# Patient Record
Sex: Male | Born: 1960 | Race: White | Hispanic: No | Marital: Married | Smoking: Never smoker
Health system: Southern US, Community
[De-identification: ages and names within clinical notes are randomized; demographics above are authoritative.]

## PROBLEM LIST (undated history)

## (undated) DIAGNOSIS — J0100 Acute maxillary sinusitis, unspecified: Secondary | ICD-10-CM

## (undated) DIAGNOSIS — R002 Palpitations: Secondary | ICD-10-CM

## (undated) DIAGNOSIS — E785 Hyperlipidemia, unspecified: Secondary | ICD-10-CM

## (undated) DIAGNOSIS — R519 Headache, unspecified: Secondary | ICD-10-CM

## (undated) DIAGNOSIS — M1990 Unspecified osteoarthritis, unspecified site: Secondary | ICD-10-CM

## (undated) DIAGNOSIS — R51 Headache: Secondary | ICD-10-CM

## (undated) DIAGNOSIS — I10 Essential (primary) hypertension: Secondary | ICD-10-CM

## (undated) DIAGNOSIS — H9319 Tinnitus, unspecified ear: Secondary | ICD-10-CM

## (undated) DIAGNOSIS — E119 Type 2 diabetes mellitus without complications: Secondary | ICD-10-CM

## (undated) DIAGNOSIS — I639 Cerebral infarction, unspecified: Secondary | ICD-10-CM

## (undated) HISTORY — PX: OTHER SURGICAL HISTORY: SHX169

## (undated) HISTORY — DX: Tinnitus, unspecified ear: H93.19

## (undated) HISTORY — DX: Essential (primary) hypertension: I10

## (undated) HISTORY — DX: Headache: R51

## (undated) HISTORY — DX: Hyperlipidemia, unspecified: E78.5

## (undated) HISTORY — DX: Acute maxillary sinusitis, unspecified: J01.00

## (undated) HISTORY — DX: Type 2 diabetes mellitus without complications: E11.9

## (undated) HISTORY — DX: Cerebral infarction, unspecified: I63.9

## (undated) HISTORY — PX: WISDOM TOOTH EXTRACTION: SHX21

## (undated) HISTORY — PX: BRONCHOSCOPY: SUR163

## (undated) HISTORY — DX: Unspecified osteoarthritis, unspecified site: M19.90

## (undated) HISTORY — DX: Palpitations: R00.2

## (undated) HISTORY — DX: Headache, unspecified: R51.9

---

## 2015-09-16 DIAGNOSIS — I639 Cerebral infarction, unspecified: Secondary | ICD-10-CM

## 2015-09-16 HISTORY — DX: Cerebral infarction, unspecified: I63.9

## 2016-04-10 ENCOUNTER — Ambulatory Visit (INDEPENDENT_AMBULATORY_CARE_PROVIDER_SITE_OTHER): Payer: BC Managed Care – PPO | Admitting: Neurology

## 2016-04-10 ENCOUNTER — Encounter: Payer: Self-pay | Admitting: Neurology

## 2016-04-10 VITALS — BP 132/92 | HR 92 | Ht 73.0 in | Wt 225.6 lb

## 2016-04-10 DIAGNOSIS — I639 Cerebral infarction, unspecified: Secondary | ICD-10-CM

## 2016-04-10 DIAGNOSIS — I10 Essential (primary) hypertension: Secondary | ICD-10-CM | POA: Diagnosis not present

## 2016-04-10 DIAGNOSIS — G8194 Hemiplegia, unspecified affecting left nondominant side: Secondary | ICD-10-CM | POA: Insufficient documentation

## 2016-04-10 DIAGNOSIS — E0800 Diabetes mellitus due to underlying condition with hyperosmolarity without nonketotic hyperglycemic-hyperosmolar coma (NKHHC): Secondary | ICD-10-CM | POA: Diagnosis not present

## 2016-04-10 DIAGNOSIS — I6350 Cerebral infarction due to unspecified occlusion or stenosis of unspecified cerebral artery: Secondary | ICD-10-CM | POA: Diagnosis not present

## 2016-04-10 DIAGNOSIS — I6381 Other cerebral infarction due to occlusion or stenosis of small artery: Secondary | ICD-10-CM

## 2016-04-10 DIAGNOSIS — E119 Type 2 diabetes mellitus without complications: Secondary | ICD-10-CM | POA: Insufficient documentation

## 2016-04-10 NOTE — Progress Notes (Signed)
[facility] [address] [place]. [place] [id number] [phone]       OFFICE CONSULT NOTE  Mr. Todd SomJohn Haley Date of Birth:  12/16/1960 Medical Record Number:  [id number]   Referring MD:  Todd PeriAshish Haley Reason for Referral:  Stroke  HPI: [name] is a 55 year old Caucasian male who is accompanied today by his wife. He woke up on 03/19/16 with sudden onset of left hemiparesis. He was able to walk but clearly noticed that his left leg was dragging in his left hand was weak. [facility] CT scan of the head was unremarkable but subsequent MRI scan showed a right pontine infarct. CT angiogram of the brain and neck both revealed no significant intracranial or extracranial stenosis. I have personally reviewed the imaging studies. Transthoracic echocardiogram showed normal ejection fraction without cardiac source of embolism. The hospital records that I have reviewed the not show any results for lipid profile and A1c. Patient was started on aspirin and Plavix for stroke prevention and started physical and occupation therapy. He he had a urology consult rectally neurology. Patient is currently at home and getting outpatient therapy. Is able to walk with a cane. He has noticed improvement in his left-sided strength but his left hand and grip is still weak and he drags his left leg. His toileting aspirin and Plavix with only minor bruising but no bleeding episodes. He states his blood pressure is well controlled and today it is 132/92. He states his sugars also well controlled. He has not been started on statin. He has no prior history of strokes, TIAs or any neurological problems. He works as a Runner, broadcasting/film/videoteacher in [employer] and is currently on medical leave until next month.  ROS:   14 system review of systems is positive for  weakness, gait difficulty, slurred speech, ringing in the ears and all other systems negative PMH:  Past Medical History:  Diagnosis Date    . Acute maxillary antritis   . Arthritis   . Diabetes mellitus without complication (HCC)   . Headache   . Hyperlipidemia   . Hypertension   . Palpitations   . Stroke (HCC)   . Tinnitus     Social History:  Social History   Social History  . Marital status: Married    Spouse name: N/A  . Number of children: N/A  . Years of education: N/A   Occupational History  . Not on file.   Social History Main Topics  . Smoking status: Never Smoker  . Smokeless tobacco: Never Used  . Alcohol use 0.6 oz/week    1 Cans of beer per week     Comment: socially  . Drug use: No  . Sexual activity: Not on file   Other Topics Concern  . Not on file   Social History Narrative  . No narrative on file    Medications:   No current outpatient prescriptions on file prior to visit.   No current facility-administered medications on file prior to visit.     Allergies:   Allergies  Allergen Reactions  . Other     Peanuts allergy    Physical Exam General: well developed, well nourished middle-age Caucasian male, seated, in no evident distress Head: head normocephalic and atraumatic.   Neck: supple with no carotid or supraclavicular bruits Cardiovascular: regular rate and rhythm, no murmurs Musculoskeletal: no deformity Skin:  no rash/petichiae Vascular:  Normal pulses all extremities  Neurologic Exam Mental Status: Awake and  fully alert. Oriented to place and time. Recent and remote memory intact. Attention span, concentration and fund of knowledge appropriate. Mood and affect appropriate.  Cranial Nerves: Fundoscopic exam reveals sharp disc margins. Pupils equal, briskly reactive to light. Extraocular movements full without nystagmus. Visual fields full to confrontation. Hearing intact. Mild left lower facial weakness. Facial sensation intact. Face, tongue, palate moves normally and symmetrically.  Motor: Normal bulk and tone. Normal strength in all tested extremity muscles on the  right side but mild left hemiparesis 4/5 strength with weakness of left grip, triceps, and left hip and ankle dorsiflexors. Fine finger movements are diminished on the left. Orbits right over left upper extremity.. Tone is increased in the left leg compared to the right. Mild left finger-to-nose dysmetria. Sensory.: intact to touch , pinprick , position and vibratory sensation.  Coordination: Rapid alternating movements normal in all extremities. Finger-to-nose and heel-to-shin performed accurately bilaterally. Gait and Station: Arises from chair without difficulty. Stance is normal. Gait demonstrates mild dragging of the left leg and stiffness.. Unable to heel, toe and tandem walk without difficulty.  Reflexes: 1+ and symmetric. Toes downgoing.   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 Vascular risk factors of hypertension and diabetes.    PLAN: I had a long d/w patient and his wife about his recent  Pontine lacunar stroke, risk for recurrent stroke/TIAs, personally independently reviewed imaging studies and stroke evaluation results and answered questions.Continue Plavix 75 mg daily but discontinue aspirin as I do not see any benefit in dual antiplatelet therapy for lacunar disease for secondary stroke prevention and maintain strict control of hypertension with blood pressure goal below 130/90, diabetes with hemoglobin A1c goal below 6.5% and lipids with LDL cholesterol goal below 70 mg/dL. I also advised the patient to eat a healthy diet with plenty of whole grains, cereals, fruits and vegetables, exercise regularly and maintain ideal body weight check fasting lipid profile and hemoglobin A1c. Continue ongoing physical and occupational therapy. I advised the patient to use a cane at all times for gait and balance. Greater than 50% time during this 45 minute consultation visit  was spent on counseling and coordination of care about stroke risk, prevention and treatment He will return for follow-up with me in 2 months or call earlier if necessary Delia Heady, MD  [facility] [address] [place], [place] [address]  Phone [phone] Fax [phone] Note: This document was prepared with digital dictation and possible smart phrase technology. Any transcriptional errors that result from this process are unintentional.

## 2016-04-10 NOTE — Patient Instructions (Signed)
I had a long d/w patient and his wife about his recent  Pontine lacunar stroke, risk for recurrent stroke/TIAs, personally independently reviewed imaging studies and stroke evaluation results and answered questions.Continue Plavix 75 mg daily but discontinue aspirin as I do not see any benefit in dual antiplatelet therapy for lacunar disease for secondary stroke prevention and maintain strict control of hypertension with blood pressure goal below 130/90, diabetes with hemoglobin A1c goal below 6.5% and lipids with LDL cholesterol goal below 70 mg/dL. I also advised the patient to eat a healthy diet with plenty of whole grains, cereals, fruits and vegetables, exercise regularly and maintain ideal body weight check fasting lipid profile and hemoglobin A1c. Continue ongoing physical and occupational therapy. I advised the patient to use a cane at all times for gait and balance. He will return for follow-up with me in 2 months or call earlier if necessary. Stroke Prevention Some medical conditions and behaviors are associated with an increased chance of having a stroke. You may prevent a stroke by making healthy choices and managing medical conditions. HOW CAN I REDUCE MY RISK OF HAVING A STROKE?   Stay physically active. Get at least 30 minutes of activity on most or all days.  Do not smoke. It may also be helpful to avoid exposure to secondhand smoke.  Limit alcohol use. Moderate alcohol use is considered to be:  No more than 2 drinks per day for men.  No more than 1 drink per day for nonpregnant women.  Eat healthy foods. This involves:  Eating 5 or more servings of fruits and vegetables a day.  Making dietary changes that address high blood pressure (hypertension), high cholesterol, diabetes, or obesity.  Manage your cholesterol levels.  Making food choices that are high in fiber and low in saturated fat, trans fat, and cholesterol may control cholesterol levels.  Take any prescribed  medicines to control cholesterol as directed by your health care provider.  Manage your diabetes.  Controlling your carbohydrate and sugar intake is recommended to manage diabetes.  Take any prescribed medicines to control diabetes as directed by your health care provider.  Control your hypertension.  Making food choices that are low in salt (sodium), saturated fat, trans fat, and cholesterol is recommended to manage hypertension.  Ask your health care provider if you need treatment to lower your blood pressure. Take any prescribed medicines to control hypertension as directed by your health care provider.  If you are 6118-55 years of age, have your blood pressure checked every 3-5 years. If you are 55 years of age or older, have your blood pressure checked every year.  Maintain a healthy weight.  Reducing calorie intake and making food choices that are low in sodium, saturated fat, trans fat, and cholesterol are recommended to manage weight.  Stop drug abuse.  Avoid taking birth control pills.  Talk to your health care provider about the risks of taking birth control pills if you are over 55 years old, smoke, get migraines, or have ever had a blood clot.  Get evaluated for sleep disorders (sleep apnea).  Talk to your health care provider about getting a sleep evaluation if you snore a lot or have excessive sleepiness.  Take medicines only as directed by your health care provider.  For some people, aspirin or blood thinners (anticoagulants) are helpful in reducing the risk of forming abnormal blood clots that can lead to stroke. If you have the irregular heart rhythm of atrial fibrillation, you should be  on a blood thinner unless there is a good reason you cannot take them.  Understand all your medicine instructions.  Make sure that other conditions (such as anemia or atherosclerosis) are addressed. SEEK IMMEDIATE MEDICAL CARE IF:   You have sudden weakness or numbness of the  face, arm, or leg, especially on one side of the body.  Your face or eyelid droops to one side.  You have sudden confusion.  You have trouble speaking (aphasia) or understanding.  You have sudden trouble seeing in one or both eyes.  You have sudden trouble walking.  You have dizziness.  You have a loss of balance or coordination.  You have a sudden, severe headache with no known cause.  You have new chest pain or an irregular heartbeat. Any of these symptoms may represent a serious problem that is an emergency. Do not wait to see if the symptoms will go away. Get medical help at once. Call your local emergency services (911 in [place] Do not drive yourself to the hospital.   This information is not intended to replace advice given to you by your health care provider. Make sure you discuss any questions you have with your health care provider.   Document Released: 08/23/2004 Document Revised: 08/06/2014 Document Reviewed: 01/16/2013 Elsevier Interactive Patient Education [employer]

## 2016-06-04 ENCOUNTER — Ambulatory Visit: Payer: BC Managed Care – PPO | Admitting: Neurology

## 2018-07-30 DIAGNOSIS — I499 Cardiac arrhythmia, unspecified: Secondary | ICD-10-CM

## 2018-07-30 HISTORY — DX: Cardiac arrhythmia, unspecified: I49.9

## 2018-12-22 DIAGNOSIS — I219 Acute myocardial infarction, unspecified: Secondary | ICD-10-CM

## 2018-12-22 HISTORY — DX: Acute myocardial infarction, unspecified: I21.9

## 2018-12-23 ENCOUNTER — Ambulatory Visit (HOSPITAL_COMMUNITY)
Admission: EM | Disposition: A | Discharge status: Home / Self Care | Payer: Self-pay | Source: Other Acute Inpatient Hospital | Attending: Cardiology

## 2018-12-23 ENCOUNTER — Encounter (HOSPITAL_COMMUNITY): Payer: Self-pay

## 2018-12-23 ENCOUNTER — Observation Stay (HOSPITAL_COMMUNITY)
Admission: EM | Admit: 2018-12-23 | Discharge: 2018-12-24 | Disposition: A | Discharge status: Home / Self Care | Payer: BC Managed Care – PPO | Source: Other Acute Inpatient Hospital | Attending: Cardiology | Admitting: Cardiology

## 2018-12-23 ENCOUNTER — Other Ambulatory Visit: Payer: Self-pay

## 2018-12-23 DIAGNOSIS — E119 Type 2 diabetes mellitus without complications: Secondary | ICD-10-CM | POA: Diagnosis not present

## 2018-12-23 DIAGNOSIS — I10 Essential (primary) hypertension: Secondary | ICD-10-CM | POA: Insufficient documentation

## 2018-12-23 DIAGNOSIS — E876 Hypokalemia: Secondary | ICD-10-CM | POA: Diagnosis not present

## 2018-12-23 DIAGNOSIS — G8194 Hemiplegia, unspecified affecting left nondominant side: Secondary | ICD-10-CM | POA: Diagnosis not present

## 2018-12-23 DIAGNOSIS — Z8249 Family history of ischemic heart disease and other diseases of the circulatory system: Secondary | ICD-10-CM | POA: Insufficient documentation

## 2018-12-23 DIAGNOSIS — I2510 Atherosclerotic heart disease of native coronary artery without angina pectoris: Principal | ICD-10-CM

## 2018-12-23 DIAGNOSIS — Z79899 Other long term (current) drug therapy: Secondary | ICD-10-CM | POA: Diagnosis not present

## 2018-12-23 DIAGNOSIS — Z7984 Long term (current) use of oral hypoglycemic drugs: Secondary | ICD-10-CM | POA: Insufficient documentation

## 2018-12-23 DIAGNOSIS — Z7982 Long term (current) use of aspirin: Secondary | ICD-10-CM | POA: Insufficient documentation

## 2018-12-23 DIAGNOSIS — I214 Non-ST elevation (NSTEMI) myocardial infarction: Secondary | ICD-10-CM | POA: Diagnosis present

## 2018-12-23 DIAGNOSIS — Z8673 Personal history of transient ischemic attack (TIA), and cerebral infarction without residual deficits: Secondary | ICD-10-CM | POA: Insufficient documentation

## 2018-12-23 DIAGNOSIS — Z7902 Long term (current) use of antithrombotics/antiplatelets: Secondary | ICD-10-CM | POA: Insufficient documentation

## 2018-12-23 DIAGNOSIS — Z823 Family history of stroke: Secondary | ICD-10-CM | POA: Diagnosis not present

## 2018-12-23 DIAGNOSIS — Z1159 Encounter for screening for other viral diseases: Secondary | ICD-10-CM | POA: Diagnosis not present

## 2018-12-23 DIAGNOSIS — I6350 Cerebral infarction due to unspecified occlusion or stenosis of unspecified cerebral artery: Secondary | ICD-10-CM | POA: Diagnosis present

## 2018-12-23 DIAGNOSIS — E785 Hyperlipidemia, unspecified: Secondary | ICD-10-CM | POA: Diagnosis not present

## 2018-12-23 DIAGNOSIS — Z9861 Coronary angioplasty status: Secondary | ICD-10-CM

## 2018-12-23 DIAGNOSIS — M1990 Unspecified osteoarthritis, unspecified site: Secondary | ICD-10-CM | POA: Insufficient documentation

## 2018-12-23 HISTORY — PX: CORONARY STENT INTERVENTION: CATH118234

## 2018-12-23 HISTORY — PX: LEFT HEART CATH AND CORONARY ANGIOGRAPHY: CATH118249

## 2018-12-23 LAB — BASIC METABOLIC PANEL
Anion gap: 11 (ref 5–15)
BUN: 19 mg/dL (ref 6–20)
CO2: 28 mmol/L (ref 22–32)
Calcium: 9.3 mg/dL (ref 8.9–10.3)
Chloride: 101 mmol/L (ref 98–111)
Creatinine, Ser: 0.85 mg/dL (ref 0.61–1.24)
GFR calc Af Amer: >60 mL/min (ref >60)
GFR calc non Af Amer: >60 mL/min (ref >60)
Glucose, Bld: 186 mg/dL — ABNORMAL HIGH (ref 70–99)
Potassium: 3.2 mmol/L — ABNORMAL LOW (ref 3.5–5.1)
Sodium: 140 mmol/L (ref 135–145)

## 2018-12-23 LAB — CBC
HCT: 44.5 % (ref 39.0–52.0)
Hemoglobin: 15.2 g/dL (ref 13.0–17.0)
MCH: 28.6 pg (ref 26.0–34.0)
MCHC: 34.2 g/dL (ref 30.0–36.0)
MCV: 83.6 fL (ref 80.0–100.0)
Platelets: 174 10*3/uL (ref 150–400)
RBC: 5.32 MIL/uL (ref 4.22–5.81)
RDW: 13 % (ref 11.5–15.5)
WBC: 8.4 10*3/uL (ref 4.0–10.5)
nRBC: 0 % (ref 0.0–0.2)

## 2018-12-23 LAB — LIPID PANEL
Cholesterol: 214 mg/dL — ABNORMAL HIGH (ref 0–200)
HDL: 41 mg/dL (ref >40)
LDL Cholesterol: 131 mg/dL — ABNORMAL HIGH (ref 0–99)
Total CHOL/HDL Ratio: 5.2 RATIO
Triglycerides: 210 mg/dL — ABNORMAL HIGH (ref <150)
VLDL: 42 mg/dL — ABNORMAL HIGH (ref 0–40)

## 2018-12-23 LAB — HEMOGLOBIN A1C
Hgb A1c MFr Bld: 7.9 % — ABNORMAL HIGH (ref 4.8–5.6)
Mean Plasma Glucose: 180.03 mg/dL

## 2018-12-23 LAB — TROPONIN I: Troponin I: 9.11 ng/mL (ref <0.03)

## 2018-12-23 LAB — GLUCOSE, CAPILLARY
Glucose-Capillary: 153 mg/dL — ABNORMAL HIGH (ref 70–99)
Glucose-Capillary: 129 mg/dL — ABNORMAL HIGH (ref 70–99)
Glucose-Capillary: 201 mg/dL — ABNORMAL HIGH (ref 70–99)

## 2018-12-23 LAB — HEPARIN LEVEL (UNFRACTIONATED): Heparin Unfractionated: 0.36 IU/mL (ref 0.30–0.70)

## 2018-12-23 LAB — POCT ACTIVATED CLOTTING TIME
Activated Clotting Time: 279 seconds
Activated Clotting Time: 296 seconds

## 2018-12-23 LAB — MRSA PCR SCREENING: MRSA by PCR: NEGATIVE

## 2018-12-23 LAB — SARS CORONAVIRUS 2 BY RT PCR (HOSPITAL ORDER, PERFORMED IN ~~LOC~~ HOSPITAL LAB): SARS Coronavirus 2: NEGATIVE

## 2018-12-23 SURGERY — LEFT HEART CATH AND CORONARY ANGIOGRAPHY
Anesthesia: LOCAL

## 2018-12-23 MED ORDER — NITROGLYCERIN 0.4 MG SL SUBL: 0.4000 mg | SUBLINGUAL_TABLET | SUBLINGUAL | Status: DC | PRN

## 2018-12-23 MED ORDER — IRBESARTAN 150 MG PO TABS
300.0000 mg | ORAL_TABLET | Freq: Every day | ORAL | Status: DC
  Administered 2018-12-23 – 2018-12-24 (×2): 300 mg via ORAL
  Filled 2018-12-23 (×2): qty 2

## 2018-12-23 MED ORDER — VALSARTAN-HYDROCHLOROTHIAZIDE 320-25 MG PO TABS: 1.0000 | ORAL_TABLET | Freq: Every day | ORAL | Status: DC

## 2018-12-23 MED ORDER — INSULIN ASPART 100 UNIT/ML ~~LOC~~ SOLN
0.0000 [IU] | Freq: Three times a day (TID) | SUBCUTANEOUS | Status: DC
  Administered 2018-12-23: 3 [IU] via SUBCUTANEOUS
  Administered 2018-12-24: 2 [IU] via SUBCUTANEOUS

## 2018-12-23 MED ORDER — SODIUM CHLORIDE 0.9% FLUSH
3.0000 mL | Freq: Two times a day (BID) | INTRAVENOUS | Status: DC
  Administered 2018-12-24: 03:00:00 3 mL via INTRAVENOUS

## 2018-12-23 MED ORDER — SODIUM CHLORIDE 0.9 % WEIGHT BASED INFUSION
3.0000 mL/kg/h | INTRAVENOUS | Status: AC
  Administered 2018-12-23: 3 mL/kg/h via INTRAVENOUS

## 2018-12-23 MED ORDER — NITROGLYCERIN 1 MG/10 ML FOR IR/CATH LAB
INTRA_ARTERIAL | Status: DC | PRN
  Administered 2018-12-23: 150 ug via INTRACORONARY

## 2018-12-23 MED ORDER — SODIUM CHLORIDE 0.9 % IV SOLN: 250.0000 mL | INTRAVENOUS | Status: DC | PRN

## 2018-12-23 MED ORDER — MIDAZOLAM HCL 5 MG/5ML IJ SOLN
INTRAMUSCULAR | Status: AC
  Filled 2018-12-23: qty 5

## 2018-12-23 MED ORDER — HEPARIN (PORCINE) 25000 UT/250ML-% IV SOLN
1200.0000 [IU]/h | INTRAVENOUS | Status: DC
  Administered 2018-12-23: 1200 [IU]/h via INTRAVENOUS

## 2018-12-23 MED ORDER — LIDOCAINE HCL (PF) 1 % IJ SOLN
INTRAMUSCULAR | Status: DC | PRN
  Administered 2018-12-23: 3 mL

## 2018-12-23 MED ORDER — HEPARIN (PORCINE) IN NACL 1000-0.9 UT/500ML-% IV SOLN
INTRAVENOUS | Status: DC | PRN
  Administered 2018-12-23: 500 mL

## 2018-12-23 MED ORDER — HEPARIN (PORCINE) IN NACL 1000-0.9 UT/500ML-% IV SOLN
INTRAVENOUS | Status: AC
  Filled 2018-12-23: qty 1000

## 2018-12-23 MED ORDER — METOPROLOL TARTRATE 12.5 MG HALF TABLET
12.5000 mg | ORAL_TABLET | Freq: Two times a day (BID) | ORAL | Status: DC
  Administered 2018-12-23 – 2018-12-24 (×3): 12.5 mg via ORAL
  Filled 2018-12-23 (×3): qty 1

## 2018-12-23 MED ORDER — SODIUM CHLORIDE 0.9% FLUSH: 3.0000 mL | INTRAVENOUS | Status: DC | PRN

## 2018-12-23 MED ORDER — HEPARIN SODIUM (PORCINE) 1000 UNIT/ML IJ SOLN
INTRAMUSCULAR | Status: AC
  Filled 2018-12-23: qty 1

## 2018-12-23 MED ORDER — TICAGRELOR 90 MG PO TABS
ORAL_TABLET | ORAL | Status: AC
  Filled 2018-12-23: qty 2

## 2018-12-23 MED ORDER — FENTANYL CITRATE (PF) 100 MCG/2ML IJ SOLN
INTRAMUSCULAR | Status: AC
  Filled 2018-12-23: qty 2

## 2018-12-23 MED ORDER — FENTANYL CITRATE (PF) 100 MCG/2ML IJ SOLN
INTRAMUSCULAR | Status: DC | PRN
  Administered 2018-12-23 (×2): 25 ug via INTRAVENOUS

## 2018-12-23 MED ORDER — VERAPAMIL HCL 2.5 MG/ML IV SOLN
INTRAVENOUS | Status: AC
  Filled 2018-12-23: qty 2

## 2018-12-23 MED ORDER — LIDOCAINE HCL (PF) 1 % IJ SOLN
INTRAMUSCULAR | Status: AC
  Filled 2018-12-23: qty 30

## 2018-12-23 MED ORDER — ATORVASTATIN CALCIUM 40 MG PO TABS
40.0000 mg | ORAL_TABLET | Freq: Every day | ORAL | Status: DC
  Administered 2018-12-23: 40 mg via ORAL
  Filled 2018-12-23: qty 1

## 2018-12-23 MED ORDER — SODIUM CHLORIDE 0.9 % WEIGHT BASED INFUSION
1.0000 mL/kg/h | INTRAVENOUS | Status: AC
  Administered 2018-12-23 (×2): 1 mL/kg/h via INTRAVENOUS

## 2018-12-23 MED ORDER — HEPARIN SODIUM (PORCINE) 1000 UNIT/ML IJ SOLN
INTRAMUSCULAR | Status: DC | PRN
  Administered 2018-12-23: 2000 [IU] via INTRAVENOUS
  Administered 2018-12-23 (×2): 5000 [IU] via INTRAVENOUS

## 2018-12-23 MED ORDER — HYDRALAZINE HCL 20 MG/ML IJ SOLN: 10.0000 mg | INTRAMUSCULAR | Status: AC | PRN

## 2018-12-23 MED ORDER — NITROGLYCERIN 1 MG/10 ML FOR IR/CATH LAB
INTRA_ARTERIAL | Status: AC
  Filled 2018-12-23: qty 10

## 2018-12-23 MED ORDER — VERAPAMIL HCL 2.5 MG/ML IV SOLN
INTRAVENOUS | Status: DC | PRN
  Administered 2018-12-23: 10 mL via INTRA_ARTERIAL

## 2018-12-23 MED ORDER — POTASSIUM CHLORIDE CRYS ER 20 MEQ PO TBCR
60.0000 meq | EXTENDED_RELEASE_TABLET | Freq: Once | ORAL | Status: AC
  Administered 2018-12-23: 60 meq via ORAL
  Filled 2018-12-23: qty 3

## 2018-12-23 MED ORDER — TICAGRELOR 90 MG PO TABS
90.0000 mg | ORAL_TABLET | Freq: Two times a day (BID) | ORAL | Status: DC
  Administered 2018-12-24: 90 mg via ORAL
  Filled 2018-12-23: qty 1

## 2018-12-23 MED ORDER — HYDROCHLOROTHIAZIDE 25 MG PO TABS
25.0000 mg | ORAL_TABLET | Freq: Every day | ORAL | Status: DC
  Administered 2018-12-23 – 2018-12-24 (×2): 25 mg via ORAL
  Filled 2018-12-23 (×2): qty 1

## 2018-12-23 MED ORDER — TICAGRELOR 90 MG PO TABS
ORAL_TABLET | ORAL | Status: DC | PRN
  Administered 2018-12-23: 180 mg via ORAL

## 2018-12-23 MED ORDER — SODIUM CHLORIDE 0.9 % WEIGHT BASED INFUSION
1.0000 mL/kg/h | INTRAVENOUS | Status: DC
  Administered 2018-12-23: 13:00:00 1 mL/kg/h via INTRAVENOUS

## 2018-12-23 MED ORDER — ASPIRIN EC 81 MG PO TBEC
81.0000 mg | DELAYED_RELEASE_TABLET | Freq: Every day | ORAL | Status: DC
  Administered 2018-12-23 – 2018-12-24 (×2): 81 mg via ORAL
  Filled 2018-12-23 (×2): qty 1

## 2018-12-23 MED ORDER — IOHEXOL 350 MG/ML SOLN
INTRAVENOUS | Status: DC | PRN
  Administered 2018-12-23: 150 mL via INTRA_ARTERIAL

## 2018-12-23 MED ORDER — ONDANSETRON HCL 4 MG/2ML IJ SOLN: 4.0000 mg | Freq: Four times a day (QID) | INTRAMUSCULAR | Status: DC | PRN

## 2018-12-23 MED ORDER — CLOPIDOGREL BISULFATE 75 MG PO TABS
75.0000 mg | ORAL_TABLET | Freq: Every day | ORAL | Status: DC
  Administered 2018-12-23: 75 mg via ORAL
  Filled 2018-12-23: qty 1

## 2018-12-23 MED ORDER — MIDAZOLAM HCL 2 MG/2ML IJ SOLN
INTRAMUSCULAR | Status: DC | PRN
  Administered 2018-12-23: 1 mg via INTRAVENOUS
  Administered 2018-12-23: 2 mg via INTRAVENOUS

## 2018-12-23 MED ORDER — LABETALOL HCL 5 MG/ML IV SOLN: 10.0000 mg | INTRAVENOUS | Status: AC | PRN

## 2018-12-23 MED ORDER — ACETAMINOPHEN 325 MG PO TABS: 650.0000 mg | ORAL_TABLET | ORAL | Status: DC | PRN

## 2018-12-23 SURGICAL SUPPLY — 23 items
BALLN SAPPHIRE 2.0X15 (BALLOONS) ×2
BALLN SAPPHIRE 2.5X12 (BALLOONS) ×2
BALLN SAPPHIRE ~~LOC~~ 3.5X12 (BALLOONS) ×1 IMPLANT
BALLN SAPPHIRE ~~LOC~~ 3.5X18 (BALLOONS) ×1 IMPLANT
BALLOON SAPPHIRE 2.0X15 (BALLOONS) IMPLANT
BALLOON SAPPHIRE 2.5X12 (BALLOONS) IMPLANT
CATH 5FR JL3.5 JR4 ANG PIG MP (CATHETERS) ×1 IMPLANT
CATH LAUNCHER 6FR EBU3.5 (CATHETERS) ×1 IMPLANT
CATH LAUNCHER 6FR JR4 (CATHETERS) ×1 IMPLANT
DEVICE RAD COMP TR BAND LRG (VASCULAR PRODUCTS) ×1 IMPLANT
GLIDESHEATH SLEND SS 6F .021 (SHEATH) ×1 IMPLANT
GUIDEWIRE INQWIRE 1.5J.035X260 (WIRE) IMPLANT
INQWIRE 1.5J .035X260CM (WIRE) ×2
KIT ENCORE 26 ADVANTAGE (KITS) ×1 IMPLANT
KIT HEART LEFT (KITS) ×2 IMPLANT
PACK CARDIAC CATHETERIZATION (CUSTOM PROCEDURE TRAY) ×2 IMPLANT
STENT RESOLUTE ONYX 2.0X22 (Permanent Stent) ×1 IMPLANT
STENT RESOLUTE ONYX 3.0X15 (Permanent Stent) ×1 IMPLANT
STENT RESOLUTE ONYX 3.0X26 (Permanent Stent) ×1 IMPLANT
STENT RESOLUTE ONYX 3.0X30 (Permanent Stent) ×1 IMPLANT
TRANSDUCER W/STOPCOCK (MISCELLANEOUS) ×2 IMPLANT
TUBING CIL FLEX 10 FLL-RA (TUBING) ×2 IMPLANT
WIRE COUGAR XT STRL 190CM (WIRE) ×1 IMPLANT

## 2018-12-23 NOTE — Progress Notes (Signed)
Pt belonging brought to front desk in black bagpack.  RN delivered to patient at bedside.

## 2018-12-23 NOTE — Progress Notes (Signed)
CRITICAL VALUE ALERT  Critical Value:  Troponin 9.11   Date & Time Notied:  [id number]  Dr. Jens Som noted initial troponin in notes. RN will continue to monitor.

## 2018-12-23 NOTE — Progress Notes (Addendum)
Patient here via EMS. Ambulatory. Heparin drip infusing at 1200 units. Alert and oriented x3. Able to make needs known. Answers questions appropriately. Denies pain and shortness of breath. Vitals WNL. Placed on telemetry. Afebrile. Paged cardiology to make aware of admission. Call light within reach. Placed on monitor. Heart rate 60, sinus brady. Skin assessment done with Ranae RN. Old scabs noted to bilateral lower legs. Blanchable redness noted to buttocks. Call light within reach. Oriented to room.

## 2018-12-23 NOTE — H&P (Signed)
Cardiology Admission History and Physical:   Patient ID: Todd Haley MRN: [id number]; DOB: Oct 26, 1960   Admission date: 12/23/2018  Primary Care Provider: Monico Blitz, MD Primary Cardiologist: No primary care provider on file.  Primary Electrophysiologist:  None   Chief Complaint:  Chest Pain/ NSTEMI  Patient Profile:   Todd Haley is a 58 y.o. male with a history of stroke in 2017 on Plavix, hypertension, hyperlipidemia, diabetes mellitus, and arthritis, who is transferred from [facility] for a NSTEMI.  History of Present Illness:   Mr. Todd Haley is a 58 year old male with a history of stroke in 2017, hypertension, hyperlipidemia, diabetes mellitus, and arthritis. Patient has never seen a Cardiologist or had any type of cardiac work-up.   Patient presented to the [facility] ED yesterday for evaluation of sudden substernal non-radiating chest tightness. He describes it as more of a discomfort rather than a pain and ranks it as a 3/10 on the pain scale. Onset of chest discomfort occurred at rest after eating dinner (steak and baked potato). Patient initially thought it may be reflux so he took Tums with no relief. No associated shortness of breath, diaphoresis, nausea, or vomiting. He reports he felt like his heart was racing but denies any lightheadedness, dizziness, or syncope. No orthopnea, PND, or edema. Patient measured his BP and states it was very elevated at 175/110. He states his BP is usually around 130/80. No recent fevers, chills, or body aches. He reports occasional nasal congestion due to seasonal allergies but denies any cough.  In the ED, patient hypertensive with systolic BP in the 409'W but vitals stable. EKG showed normal sinus rhythm with possible mild ST elevation in inferior leads. Troponin T elevated at 0.13 >> 0.27 >> 0.51. Chest x-ray showed no acute findings. WBC 10.1, Hgb 15.4, Plts 204. Na 141, K 3.9, Glucose 227, SCr 0.87. AST 26.9, ALT 17, Alk Phos 130. Urine  drug screen negative. Patient received Aspirin '324mg'$  and was started on IV Heparin in the ED.   Patient was transferred to [facility] for further evaluation.   At the time of this evaluation, patient is completely pain free and denies any other symptoms. Patient states the pain lasted for about 1 hour and resolved without any medications.   Patient denies any history of tobacco use.   Past Medical History:  Diagnosis Date  . Acute maxillary antritis   . Arthritis   . Diabetes mellitus without complication ([place])   . Headache   . Hyperlipidemia   . Hypertension   . Palpitations   . Stroke ([place])   . Tinnitus     Past Surgical History:  Procedure Laterality Date  . colonscopy       Medications Prior to Admission: Prior to Admission medications   Medication Sig Start Date End Date Taking? Authorizing Provider  aspirin EC 81 MG tablet Take 81 mg by mouth daily.    [provider]  clopidogrel (PLAVIX) 75 MG tablet  03/21/16   [provider]  JARDIANCE 10 MG TABS tablet  03/29/16   [provider]  metFORMIN (GLUCOPHAGE) 500 MG tablet  01/13/16   [provider]  traMADol (ULTRAM) 50 MG tablet  03/29/16   [provider]  valsartan-hydrochlorothiazide (DIOVAN-HCT) 320-25 MG tablet Take 1 tablet by mouth daily.    [provider]     Allergies:    Allergies  Allergen Reactions  . Other     Peanuts allergy    Social History:  Social History   Socioeconomic History  . Marital status: Married    Spouse name: Not on file  . Number of children: Not on file  . Years of education: Not on file  . Highest education level: Not on file  Occupational History  . Not on file  Social Needs  . Financial resource strain: Not on file  . Food insecurity:    Worry: Not on file    Inability: Not on file  . Transportation needs:    Medical: Not on file    Non-medical: Not on file  Tobacco Use  . Smoking status: Never Smoker  .  Smokeless tobacco: Never Used  Substance and Sexual Activity  . Alcohol use: Yes    Alcohol/week: 1.0 standard drinks    Types: 1 Cans of beer per week    Comment: socially  . Drug use: No  . Sexual activity: Not on file  Lifestyle  . Physical activity:    Days per week: Not on file    Minutes per session: Not on file  . Stress: Not on file  Relationships  . Social connections:    Talks on phone: Not on file    Gets together: Not on file    Attends religious service: Not on file    Active member of club or organization: Not on file    Attends meetings of clubs or organizations: Not on file    Relationship status: Not on file  . Intimate partner violence:    Fear of current or ex partner: Not on file    Emotionally abused: Not on file    Physically abused: Not on file    Forced sexual activity: Not on file  Other Topics Concern  . Not on file  Social History Narrative  . Not on file    Family History:   The patient's family history includes Alzheimer's disease in his father; Lung cancer in his mother; Stroke in his maternal grandfather.    ROS:  Please see the history of present illness.  Review of Systems  Constitutional: Negative for chills, diaphoresis and fever.  HENT: Positive for congestion. Negative for sore throat.   Respiratory: Negative for cough, hemoptysis and shortness of breath.   Cardiovascular: Positive for chest pain and palpitations. Negative for orthopnea, leg swelling and PND.  Gastrointestinal: Negative for blood in stool, nausea and vomiting.  Genitourinary: Negative for hematuria.  Musculoskeletal: Negative for myalgias.  Neurological: Negative for dizziness and loss of consciousness.  Endo/Heme/Allergies: Positive for environmental allergies.  Psychiatric/Behavioral: Negative for substance abuse.     Physical Exam/Data:   Vitals:   12/23/18 0600  BP: (!) 149/92  Pulse: 60  Resp: 13  Temp: 98.5 F (36.9 C)  TempSrc: Oral  SpO2: 100%   Weight: 96.7 kg  Height: '6\' 2"'$  (1.88 m)   No intake or output data in the 24 hours ending 12/23/18 0651 Last 3 Weights 12/23/2018 04/10/2016  Weight (lbs) 213 lb 3 oz 225 lb 9.6 oz  Weight (kg) 96.7 kg 102.331 kg     Body mass index is 27.37 kg/m.   Physical Exam per MD:  General:  Well nourished, well developed, in no acute distress. HEENT: Normal Lymph: No adenopathy. Neck: No JVD. Endocrine:  No thryomegaly. Vascular: No carotid bruits. FA pulses 2+ bilaterally without bruits  Cardiac:  RRR. Distinct S1 and S2. No murmurs, gallops, or rubs. Lungs:  Clear to auscultation bilaterally. No wheezing, rhonchi or rales.  Abd: Soft, non-distended,  and non-tender. Bowel sounds present. Ext: No edema Musculoskeletal:  No deformities, BUE and BLE strength normal and equal Skin: Warm and dry. Neuro:  Alert and oriented x3. No focal deficits.  Psych:  Normal affect    EKG:  The ECG that was done was personally reviewed and demonstrates sinus bradycardia, rate 55 bpm, with minimal ST elevation in leads III and aVF.    Assessment and Plan:   NSTEMI - Patient transferred here from [facility] for NSTEMI. - EKG shows sinus rhythm with possible mild ST elevation in leads III and aVF. - Troponin T at [facility] elevated at 0.13 >> 0.27 >> 0.51. - Patient is currently chest pain free. - Continue IV Heparin.  - Will check hemoglobin A1c and lipid panel.  - Continue IV Heparin. - Patient on Plavix '75mg'$  daily for prior stroke.  - Will start Aspirin '81mg'$  and high-intensity statin. - Anticipate cardiac catheterization later today. Will discuss with MD.  Hypertension - BP elevated in the 170's/110's prior to ED presentation. Most recent BP 136/81. - Continue home Valsartan/HCTZ 320-'25mg'$  daily.  Hyperlipidemia - Patient has a history of hyperlipidemia but does not appear to be on any medications at home. - Will check lipid panel. - Will start Lipitor '40mg'$  daily. Alk Phos mildly  elevated at 130 at [facility] so will need to monitor liver function. Will recheck CMET tomorrow.  Diabetes Mellitus - Patient on Jardiance '25mg'$  daily and Metformin '1000mg'$  twice daily at home. - Will start start sliding scale insulin here.   History of Stroke - Patient has a history of stroke in 2017 and is on Plavix '75mg'$  daily.  Severity of Illness: The appropriate patient status for this patient is OBSERVATION. Observation status is judged to be reasonable and necessary in order to provide the required intensity of service to ensure the patient's safety. The patient's presenting symptoms, physical exam findings, and initial radiographic and laboratory data in the context of their medical condition is felt to place them at decreased risk for further clinical deterioration. Furthermore, it is anticipated that the patient will be medically stable for discharge from the hospital within 2 midnights of admission. The following factors support the patient status of observation.   " The patient's presenting symptoms include chest pain. " The physical exam findings are as above. " The initial radiographic and laboratory data are elevated troponin.     For questions or updates, please contact [place] Please consult [email or web address] for contact info under        Signed, Darreld Mclean, PA-C  12/23/2018 6:51 AM   As above, patient seen and examined.  Briefly he is a 58 year old male with past medical history of diabetes mellitus for 5 years, hypertension, hyperlipidemia, prior CVA admitted with non-ST elevation myocardial infarction.  No prior cardiac history.  After eating dinner last evening he developed pain in the substernal/left chest area described as a tightness.  There was no radiation.  No associated symptoms.  Not pleuritic or positional.  Lasted 1 hour and resolved.  Patient seen at [facility] and laboratories showed elevated troponin T of 0.51.  Initial tropnin here 9.11.   Transferred for further management.  Presently pain-free.  Electrocardiogram showed subtle T wave inversion in the inferior leads.  1 non-ST elevation myocardial infarction-continue aspirin, heparin; continue Plavix as he has been on this medication at home for prior CVA.  Add low-dose metoprolol 12.5 mg twice daily.  Add Lipitor.  Plan to proceed with cardiac  catheterization.  The risks and benefits including myocardial infarction, CVA and death discussed and he agrees to proceed.  2 hypertension-patient's blood pressure was elevated at time of admission but now improved.  Continue present medications and follow.  3 diabetes mellitus-follow CBGs.  Hold Glucophage for 48 hours following procedure.  4 hyperlipidemia-add Lipitor 40 mg daily.  Check lipids and liver in 8 weeks.  5 mildly elevated alkaline phosphatase-follow-up primary care following discharge.  Patient states he has had some elevation in liver functions in the past.  6 history of CVA-on Plavix prior to admission.  Kirk Ruths, MD

## 2018-12-23 NOTE — Interval H&P Note (Signed)
Cath Lab Visit (complete for each Cath Lab visit)  Clinical Evaluation Leading to the Procedure:   ACS: Yes.    Non-ACS:    Anginal Classification: CCS IV  Anti-ischemic medical therapy: Minimal Therapy (1 class of medications)  Non-Invasive Test Results: No non-invasive testing performed  Prior CABG: No previous CABG      History and Physical Interval Note:  12/23/2018 3:52 PM  Todd Haley  has presented today for surgery, with the diagnosis of nonstemi.  The various methods of treatment have been discussed with the patient and family. After consideration of risks, benefits and other options for treatment, the patient has consented to  Procedure(s): LEFT HEART CATH AND CORONARY ANGIOGRAPHY (N/A) as a surgical intervention.  The patient's history has been reviewed, patient examined, no change in status, stable for surgery.  I have reviewed the patient's chart and labs.  Questions were answered to the patient's satisfaction.     Tonny Bollman

## 2018-12-23 NOTE — Progress Notes (Addendum)
ANTICOAGULATION CONSULT NOTE - Initial Consult  Pharmacy Consult for heparin Indication: chest pain/ACS  Allergies  Allergen Reactions  . Other     Peanuts allergy    Patient Measurements: Height: 6\' 2"  (188 cm) Weight: 213 lb 3 oz (96.7 kg) IBW/kg (Calculated) : 82.2 Heparin Dosing Weight: 96kg  Vital Signs: Temp: 97.7 F (36.5 C) (05/26 0832) Temp Source: Oral (05/26 0832) BP: 124/79 (05/26 0832) Pulse Rate: 62 (05/26 0832)  Labs: Recent Labs    12/23/18 0729  TROPONINI 9.11*    CrCl cannot be calculated (No successful lab value found.).   Medical History: Past Medical History:  Diagnosis Date  . Acute maxillary antritis   . Arthritis   . Diabetes mellitus without complication (HCC)   . Headache   . Hyperlipidemia   . Hypertension   . Palpitations   . Stroke (HCC)   . Tinnitus     Assessment: 58 year old male transferred from [facility] for chest pain, r/o acs. Patient was started on IV heparin last night, continues today. Cards planning cath. Baseline cbc wnl, renal function is normal. No anticoagulation prior to admit.   Goal of Therapy:  Heparin level 0.3-0.7 units/ml Monitor platelets by anticoagulation protocol: Yes   Plan:  Continue heparin infusion at 1200 units/hr Check anti-Xa level this am unless cath Continue to monitor H&H and platelets  Sheppard Coil PharmD., BCPS Clinical Pharmacist 12/23/2018 8:51 AM  Addendum:  Heparin level this morning is within goal range. No dose adjustments warranted at this time.   12/23/2018 12:55 PM

## 2018-12-24 ENCOUNTER — Other Ambulatory Visit: Payer: Self-pay | Admitting: Cardiology

## 2018-12-24 ENCOUNTER — Encounter (HOSPITAL_COMMUNITY): Payer: Self-pay | Admitting: Cardiovascular Disease

## 2018-12-24 DIAGNOSIS — E119 Type 2 diabetes mellitus without complications: Secondary | ICD-10-CM | POA: Diagnosis not present

## 2018-12-24 DIAGNOSIS — I10 Essential (primary) hypertension: Secondary | ICD-10-CM | POA: Diagnosis not present

## 2018-12-24 DIAGNOSIS — I214 Non-ST elevation (NSTEMI) myocardial infarction: Secondary | ICD-10-CM | POA: Diagnosis not present

## 2018-12-24 DIAGNOSIS — I2510 Atherosclerotic heart disease of native coronary artery without angina pectoris: Secondary | ICD-10-CM | POA: Diagnosis not present

## 2018-12-24 DIAGNOSIS — E785 Hyperlipidemia, unspecified: Secondary | ICD-10-CM

## 2018-12-24 DIAGNOSIS — E876 Hypokalemia: Secondary | ICD-10-CM

## 2018-12-24 DIAGNOSIS — Z9861 Coronary angioplasty status: Secondary | ICD-10-CM

## 2018-12-24 LAB — COMPREHENSIVE METABOLIC PANEL
ALT: 21 U/L (ref 0–44)
AST: 35 U/L (ref 15–41)
Albumin: 3.5 g/dL (ref 3.5–5.0)
Alkaline Phosphatase: 87 U/L (ref 38–126)
Anion gap: 10 (ref 5–15)
BUN: 20 mg/dL (ref 6–20)
CO2: 23 mmol/L (ref 22–32)
Calcium: 8.5 mg/dL — ABNORMAL LOW (ref 8.9–10.3)
Chloride: 106 mmol/L (ref 98–111)
Creatinine, Ser: 0.85 mg/dL (ref 0.61–1.24)
GFR calc Af Amer: >60 mL/min (ref >60)
GFR calc non Af Amer: >60 mL/min (ref >60)
Glucose, Bld: 144 mg/dL — ABNORMAL HIGH (ref 70–99)
Potassium: 3.1 mmol/L — ABNORMAL LOW (ref 3.5–5.1)
Sodium: 139 mmol/L (ref 135–145)
Total Bilirubin: 0.9 mg/dL (ref 0.3–1.2)
Total Protein: 5.4 g/dL — ABNORMAL LOW (ref 6.5–8.1)

## 2018-12-24 LAB — CBC
HCT: 41.2 % (ref 39.0–52.0)
Hemoglobin: 14.2 g/dL (ref 13.0–17.0)
MCH: 28.6 pg (ref 26.0–34.0)
MCHC: 34.5 g/dL (ref 30.0–36.0)
MCV: 83.1 fL (ref 80.0–100.0)
Platelets: 179 10*3/uL (ref 150–400)
RBC: 4.96 MIL/uL (ref 4.22–5.81)
RDW: 12.9 % (ref 11.5–15.5)
WBC: 8.7 10*3/uL (ref 4.0–10.5)
nRBC: 0 % (ref 0.0–0.2)

## 2018-12-24 LAB — HIV ANTIBODY (ROUTINE TESTING W REFLEX): HIV Screen 4th Generation wRfx: NONREACTIVE

## 2018-12-24 LAB — GLUCOSE, CAPILLARY: Glucose-Capillary: 142 mg/dL — ABNORMAL HIGH (ref 70–99)

## 2018-12-24 MED ORDER — TICAGRELOR 90 MG PO TABS: 90.0000 mg | ORAL_TABLET | Freq: Two times a day (BID) | ORAL | 4 refills | Status: DC

## 2018-12-24 MED ORDER — POTASSIUM CHLORIDE CRYS ER 20 MEQ PO TBCR: 60.0000 meq | EXTENDED_RELEASE_TABLET | Freq: Once | ORAL | Status: DC

## 2018-12-24 MED ORDER — POTASSIUM CHLORIDE CRYS ER 20 MEQ PO TBCR
30.0000 meq | EXTENDED_RELEASE_TABLET | ORAL | Status: AC
  Administered 2018-12-24 (×2): 30 meq via ORAL
  Filled 2018-12-24: qty 1

## 2018-12-24 MED ORDER — METOPROLOL TARTRATE 25 MG PO TABS: 12.5000 mg | ORAL_TABLET | Freq: Two times a day (BID) | ORAL | 3 refills | Status: DC

## 2018-12-24 MED ORDER — NITROGLYCERIN 0.4 MG SL SUBL: 0.4000 mg | SUBLINGUAL_TABLET | SUBLINGUAL | 0 refills | Status: AC | PRN

## 2018-12-24 MED ORDER — ROSUVASTATIN CALCIUM 5 MG PO TABS: 10.0000 mg | ORAL_TABLET | Freq: Every day | ORAL | Status: DC

## 2018-12-24 MED ORDER — ROSUVASTATIN CALCIUM 10 MG PO TABS: 10.0000 mg | ORAL_TABLET | Freq: Every day | ORAL | 3 refills | Status: AC

## 2018-12-24 MED FILL — Heparin Sod (Porcine) in NaCl IV Soln 25000 Unit/250ML-0.45%: INTRAVENOUS | Qty: 250 | Status: AC

## 2018-12-24 MED FILL — Midazolam HCl Inj 5 MG/5ML (Base Equivalent): INTRAMUSCULAR | Qty: 3 | Status: AC

## 2018-12-24 MED FILL — NITROGLYCERIN 0.4 MG TAB SL: 0.4 | 8 days supply | Qty: 25 | Fill #0

## 2018-12-24 MED FILL — BRILINTA 90 MG TABLET: 90 | 30 days supply | Qty: 60 | Fill #0 | Status: TO

## 2018-12-24 MED FILL — METOPROLOL TARTRATE 25 MG T: 25 | 30 days supply | Qty: 30 | Fill #0 | Status: TO

## 2018-12-24 NOTE — Discharge Summary (Signed)
Discharge Summary    Patient ID: Todd Haley MRN: [id number]; DOB: 1961-04-11  Admit date: 12/23/2018 Discharge date: 12/24/2018  Primary Care Provider: Monico Blitz, MD  Primary Cardiologist: Minus Breeding, MD   Discharge Diagnoses    Active Problems:   Right pontine cerebrovascular accident [facility])   Left hemiplegia ([place])   Diabetes ([place])   NSTEMI (non-ST elevated myocardial infarction) ([place])   HLD (hyperlipidemia)   CAD S/P percutaneous coronary angioplasty   Hypokalemia  Allergies Allergies  Allergen Reactions  . Other Hives    Peanuts and tree nuts allergy   Diagnostic Studies/Procedures    Cardiac catheterization 12/23/2018:   There is mild left ventricular systolic dysfunction.  LV end diastolic pressure is normal.  The left ventricular ejection fraction is 45-50% by visual estimate.   1. Severe diffuse RCA and PLA stenoses treated with a 2.0x22 Resolute Onyx DES in the PLA and overlapping 3.0x30 and 3.0x26 mm Resolute Onyx DES in the mid and distal RCA 2. Severe mid-LAD stenosis treated successfully with a 3.0x15 mm Resolute Onyx DES 3. Moderate first diagonal stenosis 4. Mild segmental LV systolic dysfunction with LVEF estimated at 45-50%  Recommend: ASA/Ticagrelor x 12 months without interruption. Pt previously on clopidogrel after a stroke - change to ticagrelor in setting NSTEMI.  History of Present Illness     Todd Haley is a 58 year old male with a history of stroke in 2017, hypertension, hyperlipidemia, diabetes mellitus, and arthritis. Patient has never seen a Cardiologist or had any type of cardiac work-up.  He presented to the [facility] ED 12/22/2018 for evaluation of sudden substernal non-radiating chest tightness. He described it as more of a discomfort rather than a pain and ranked it as a 3/10 on the pain scale. Onset of chest discomfort occurred at rest after eating dinner (steak and baked potato). Patient initially thought it may be reflux  so he took Tums with no relief. No associated shortness of breath, diaphoresis, nausea, or vomiting. He reported he felt like his heart was racing but denied any lightheadedness, dizziness, or syncope. No orthopnea, PND, or edema. Patient measured his BP and states it was very elevated at 175/110. He stated his BP is usually around 130/80. No recent fevers, chills, or body aches. He reported occasional nasal congestion due to seasonal allergies but denies any cough.  In the ED, patient hypertensive with systolic BP in the 338'S but vitals stable. EKG showed normal sinus rhythm with possible mild ST elevation in inferior leads. Troponin T elevated at 0.13 >> 0.27 >> 0.51. Chest x-ray showed no acute findings. WBC 10.1, Hgb 15.4, Plts 204. Na 141, K 3.9, Glucose 227, SCr 0.87. AST 26.9, ALT 17, Alk Phos 130. Urine drug screen negative. Patient received Aspirin '324mg'$  and was started on IV Heparin in the ED.   Patient was transferred to [facility] for further evaluation.   At the time of this evaluation, patient was completely pain free and denied any other symptoms. Patient states the pain lasted for about 1 hour and resolved without any medications.   Patient denies any history of tobacco use.   Hospital Course     Given this, he was taken to the cardiac cath lab on 12/23/2018 for further evaluation. This showed an ejection fraction 45 to 50%, severe diffuse RCA and posterior lateral disease, severe mid LAD lesion and moderate first diagonal stenosis. Patient had PCI of RCA and posterior lateral with 3 drug-eluting stents. Also had drug-eluting stent to LAD. Post  procedure he did well without complication. He has had no recurrent chest discomfort.   Medication regimen includes: Aspirin 81, Brilinta 90, metoprolol 12.5 and rosuvastatin 10, restart home valsartan-HCTZ 320-25  Other hospital issues include:  Hypertension: -BP elevated in the 170's/110's prior to ED presentation  -136/83>134/86>125/73 -Continue home Valsartan/HCTZ 320-'25mg'$  daily  Hyperlipidemia: -Uncontrolled, LDL 131 on 12/23/2018 -Did not tolerate Lipitor in the past >>started on low dose Crestor  -Repeat Lipis and LFTs on 6-8 weeks  -If unable to tolerate, consider Repatha  -Alk Phos mildly elevated at 130 at [facility] so will need to monitor liver function. -AST/ALT noted to be 35/21 on this admission   Diabetes Mellitus: -Patient on Jardiance '25mg'$  daily and Metformin '1000mg'$  twice daily at home. -Restart Jardiance today>>will need to hold Metformin until 12/26/2018 in the AM   History of Stroke: -Patient has a history of stroke in 2017 and was on Plavix '75mg'$  daily -This was discontinued in the setting of NSTEMI and replaced with Brilinta.   Hypokalemia: -K+ on discharge, 3.1 -Will discharge on 4mq Kdur daily   Consultants: None  The patient was seen and examined by Dr. CStanford Breedwho feels that he is stable and ready for discharge today, 12/24/2018 with close follow up. We will arrange.  _____________  Discharge Vitals Blood pressure 136/83, pulse 61, temperature 97.6 F (36.4 C), temperature source Oral, resp. rate 12, height '6\' 2"'$  (1.88 m), weight 96.7 kg, SpO2 99 %.  Filed Weights   12/23/18 0600  Weight: 96.7 kg   Labs & Radiologic Studies    CBC Recent Labs    12/23/18 1120 12/24/18 0219  WBC 8.4 8.7  HGB 15.2 14.2  HCT 44.5 41.2  MCV 83.6 83.1  PLT 174 1979  Basic Metabolic Panel Recent Labs    12/23/18 1120 12/24/18 0219  NA 140 139  K 3.2* 3.1*  CL 101 106  CO2 28 23  GLUCOSE 186* 144*  BUN 19 20  CREATININE 0.85 0.85  CALCIUM 9.3 8.5*   Liver Function Tests Recent Labs    12/24/18 0219  AST 35  ALT 21  ALKPHOS 87  BILITOT 0.9  PROT 5.4*  ALBUMIN 3.5   Cardiac Enzymes Recent Labs    12/23/18 0729  TROPONINI 9.11*   Hemoglobin A1C Recent Labs    12/23/18 0847  HGBA1C 7.9*   Fasting Lipid Panel Recent Labs    12/23/18  0729  CHOL 214*  HDL 41  LDLCALC 131*  TRIG 210*  CHOLHDL 5.2   Disposition   Pt is being discharged home today in good condition.  Follow-up Plans & Appointments    Follow-up Information    DLedora Bottcher PUtahFollow up on 01/01/2019.   Specialties:  Physician Assistant, Cardiology, Radiology Why:  Your follow up will be on 01/01/2019 at 0830 vis virtual visit with AFabian Sharp PPalacios  Contact information: [address] [phone]       BArnoldo Lenis MD Follow up on 01/23/2019.   Specialty:  Cardiology Why:  Your follow up will be on 01/23/2019 at 150pm with Dr. BHarl Bowie This will be a virtual visit  Contact information: [place] [phone]         Discharge Instructions    Call MD for:  difficulty breathing, headache or visual disturbances   Complete by:  As directed    Call MD for:  hives  Complete by:  As directed    Call MD for:  persistant dizziness or light-headedness   Complete by:  As directed    Call MD for:  persistant nausea and vomiting   Complete by:  As directed    Call MD for:  redness, tenderness, or signs of infection (pain, swelling, redness, odor or green/yellow discharge around incision site)   Complete by:  As directed    Call MD for:  severe uncontrolled pain   Complete by:  As directed    Call MD for:  temperature >100.4   Complete by:  As directed    Diet - low sodium heart healthy   Complete by:  As directed    Discharge instructions   Complete by:  As directed    PLEASE DO NOT [place]!!!!! Also keep a log of you blood pressures and bring back to your follow up appt. Please call the office with any questions.   Patients taking blood thinners should generally stay away from medicines like ibuprofen, Advil, Motrin, naproxen, and Aleve due to risk of stomach bleeding. You may take Tylenol as directed or talk to your primary doctor  about alternatives.  No driving for 3 days. No lifting over 5 lbs for 1 week. No sexual activity for 1 week. Keep procedure site clean & dry. If you notice increased pain, swelling, bleeding or pus, call/return!  You may shower, but no soaking baths/hot tubs/pools for 1 week.   Increase activity slowly   Complete by:  As directed       Discharge Medications   Allergies as of 12/24/2018      Reactions   Other Hives   Peanuts and tree nuts allergy      Medication List    STOP taking these medications   clopidogrel 75 MG tablet Commonly known as:  PLAVIX     TAKE these medications   aspirin EC 81 MG tablet Take 81 mg by mouth daily.   Jardiance 10 MG Tabs tablet Generic drug:  empagliflozin   metFORMIN 500 MG tablet Commonly known as:  GLUCOPHAGE Notes to patient:  Please restart on 12/26/2018 in the morning.   metoprolol tartrate 25 MG tablet Commonly known as:  LOPRESSOR Take 0.5 tablets (12.5 mg total) by mouth 2 (two) times daily.   nitroGLYCERIN 0.4 MG SL tablet Commonly known as:  NITROSTAT Place 1 tablet (0.4 mg total) under the tongue every 5 (five) minutes x 3 doses as needed for chest pain.   rosuvastatin 10 MG tablet Commonly known as:  CRESTOR Take 1 tablet (10 mg total) by mouth daily at 6 PM.   ticagrelor 90 MG Tabs tablet Commonly known as:  BRILINTA Take 1 tablet (90 mg total) by mouth 2 (two) times daily.   traMADol 50 MG tablet Commonly known as:  ULTRAM   valsartan-hydrochlorothiazide 320-25 MG tablet Commonly known as:  DIOVAN-HCT Take 1 tablet by mouth daily.        Acute coronary syndrome (MI, NSTEMI, STEMI, etc) this admission?: Yes.     AHA/ACC Clinical Performance & Quality Measures: 1. Aspirin prescribed? - Yes 2. ADP Receptor Inhibitor (Plavix/Clopidogrel, Brilinta/Ticagrelor or Effient/Prasugrel) prescribed (includes medically managed patients)? - Yes 3. Beta Blocker prescribed? - Yes 4. High Intensity Statin (Lipitor 40-'80mg'$   or Crestor 20-'40mg'$ ) prescribed? - No - intolerant  5. EF assessed during THIS hospitalization? - Yes 6. For EF <40%, was ACEI/ARB prescribed? - Not Applicable (EF >/= 27%) 7. For  EF <40%, Aldosterone Antagonist (Spironolactone or Eplerenone) prescribed? - Not Applicable (EF >/= 38%) 8. Cardiac Rehab Phase II ordered (Included Medically managed Patients)? - Yes   Outstanding Labs/Studies   Repeat BMET in 1 week   Duration of Discharge Encounter   Greater than 30 minutes including physician time.  Signed, Kathyrn Drown, NP 12/24/2018, 9:58 AM

## 2018-12-24 NOTE — Discharge Instructions (Signed)
You will continue to hold your Metformin until 12/26/2018. You will also need to go to the lab at Dr. Jenene Slicker office next week to have your blood re-drawn. You can go to this at anytime next week.

## 2018-12-24 NOTE — Progress Notes (Signed)
CARDIAC REHAB PHASE I   PRE:  Rate/Rhythm: 74 SR    BP: sitting 132/77    SaO2: 98 RA  MODE:  Ambulation: 300 ft   POST:  Rate/Rhythm: 92 SR    BP: sitting 144/87     SaO2:   Tolerated well, no c/o. Ed completed with good reception. Understands importance of Brilinta. Discussed MI, stents, restrictions, diet, exercising at home, NTG, and CRPII. Will refer to [facility].  [id number]  Harriet Masson CES, ACSM 12/24/2018 10:01 AM

## 2018-12-24 NOTE — Progress Notes (Signed)
Progress Note  Patient Name: Todd Haley Date of Encounter: 12/24/2018  Primary Cardiologist: Dr Antoine Poche  Subjective   No CP or dyspnea  Inpatient Medications    Scheduled Meds: . aspirin EC  81 mg Oral Daily  . atorvastatin  40 mg Oral q1800  . irbesartan  300 mg Oral Daily   And  . hydrochlorothiazide  25 mg Oral Daily  . insulin aspart  0-15 Units Subcutaneous TID WC  . metoprolol tartrate  12.5 mg Oral BID  . potassium chloride  30 mEq Oral Q3H  . sodium chloride flush  3 mL Intravenous Q12H  . ticagrelor  90 mg Oral BID   Continuous Infusions: . sodium chloride     PRN Meds: sodium chloride, acetaminophen, nitroGLYCERIN, ondansetron (ZOFRAN) IV, sodium chloride flush   Vital Signs    Vitals:   12/23/18 2137 12/23/18 2300 12/24/18 0300 12/24/18 0718  BP: 135/84 125/73 134/86 136/83  Pulse: 72 60 61   Resp:  12 12 12   Temp:  98.3 F (36.8 C) 97.9 F (36.6 C) 97.6 F (36.4 C)  TempSrc:  Oral Oral Oral  SpO2:  99% 99%   Weight:      Height:        Intake/Output Summary (Last 24 hours) at 12/24/2018 0804 Last data filed at 12/24/2018 0323 Gross per 24 hour  Intake 594.86 ml  Output 300 ml  Net 294.86 ml   Last 3 Weights 12/23/2018 04/10/2016  Weight (lbs) 213 lb 3 oz 225 lb 9.6 oz  Weight (kg) 96.7 kg 102.331 kg      Telemetry    Sinus- Personally Reviewed   Physical Exam   GEN: No acute distress.   Neck: No JVD Cardiac: RRR, no murmurs, rubs, or gallops.  Respiratory: Clear to auscultation bilaterally. GI: Soft, nontender, non-distended  MS: No edema; Radial cath site with no hematoma Neuro:  Nonfocal  Psych: Normal affect   Labs    Chemistry Recent Labs  Lab 12/23/18 1120 12/24/18 0219  NA 140 139  K 3.2* 3.1*  CL 101 106  CO2 28 23  GLUCOSE 186* 144*  BUN 19 20  CREATININE 0.85 0.85  CALCIUM 9.3 8.5*  PROT  --  5.4*  ALBUMIN  --  3.5  AST  --  35  ALT  --  21  ALKPHOS  --  87  BILITOT  --  0.9  GFRNONAA >60 >60  GFRAA  >60 >60  ANIONGAP 11 10     Hematology Recent Labs  Lab 12/23/18 1120 12/24/18 0219  WBC 8.4 8.7  RBC 5.32 4.96  HGB 15.2 14.2  HCT 44.5 41.2  MCV 83.6 83.1  MCH 28.6 28.6  MCHC 34.2 34.5  RDW 13.0 12.9  PLT 174 179    Cardiac Enzymes Recent Labs  Lab 12/23/18 0729  TROPONINI 9.11*    Patient Profile     58 year old male with past medical history of diabetes mellitus for 5 years, hypertension, hyperlipidemia, prior CVA admitted with non-ST elevation myocardial infarction.    Cardiac catheterization revealed ejection fraction 45 to 50%, severe diffuse RCA and posterior lateral disease, severe mid LAD lesion and moderate first diagonal stenosis.  Patient had PCI of RCA and posterior lateral with 3 drug-eluting stents.  Also had drug-eluting stent to LAD.  Assessment & Plan    1 non-ST elevation myocardial infarction-patient had multiple stents to RCA/posterior lateral as well as drug-eluting stent to LAD yesterday.  He is doing well with  no chest pain.  Plan to continue aspirin, Brilinta, metoprolol and statin.    2 hypertension-blood pressure reasonably well controlled at present.  Continue present medications and follow.  3 diabetes mellitus-Hold Glucophage for 48 hours following procedure.  4 hyperlipidemia-patient states he did not tolerate Lipitor in the past because of myalgias.  We will try low-dose Crestor at 10 mg daily.  Check lipids and liver in 8 weeks.  If he does not tolerate or we are not at goal then would consider Repatha.  5 mildly elevated alkaline phosphatase-follow-up primary care following discharge.  Patient states he has had some elevation in liver functions in the past.  6 history of CVA-Plavix discontinued and Brilinta initiated in the setting of non-ST elevation myocardial infarction.  7 hypokalemia-supplement.  Will discharge on 20 meq of K-Dur daily.  Discharge today.  Telehealth visit with APP 1 week.  Check potassium and renal function  in 1 week.  Follow-up in [place] 4 to 6 weeks.  Greater than 30 minutes APP and physician time. D2  For questions or updates, please contact [facility] Please consult [email or web address] for contact info under        Signed, Olga Millers, MD  12/24/2018, 8:04 AM

## 2018-12-25 ENCOUNTER — Encounter (HOSPITAL_COMMUNITY): Payer: Self-pay | Admitting: Cardiovascular Disease

## 2018-12-30 ENCOUNTER — Telehealth: Payer: Self-pay | Admitting: Physician Assistant

## 2018-12-30 NOTE — Telephone Encounter (Signed)
smartphone/ consent/ my chart/ pre reg completed °

## 2018-12-31 NOTE — Progress Notes (Signed)
Virtual Visit via Video Note   This visit type was conducted due to national recommendations for restrictions regarding the COVID-19 Pandemic (e.g. social distancing) in an effort to limit this patient's exposure and mitigate transmission in our community.  Due to his co-morbid illnesses, this patient is at least at moderate risk for complications without adequate follow up.  This format is felt to be most appropriate for this patient at this time.  All issues noted in this document were discussed and addressed.  A limited physical exam was performed with this format.  Please refer to the patient's chart for his consent to telehealth for [facility].   Date:  01/01/2019   ID:  Janice Norrie, DOB [birth year], MRN [id number]  Patient Location: Home Provider Location: Other:  [facility]  PCP:  Monico Blitz, MD  Cardiologist:  Minus Breeding, MD  - in [facility] Electrophysiologist:  None   Evaluation Performed:  Follow-Up Visit  Chief Complaint:  Follow up NSTEMI  History of Present Illness:    Todd Haley is a 58 y.o. male with a history of stroke in 2017 on plavix, hypertension, hyperlipidemia, diabetes mellitus, arthritis, with recent NSTEMI on 12/23/2018.  Patient presented to [facility] ER for chest pain.  He was found to have possible mild ST elevation in inferior leads and was transferred to [facility] for further evaluation.  His BP was also elevated in the 170s.  He was taken to the Cath Lab. He had 95% stenosis followed by 80% stenosis in the RCA treated with 2 overlapping DES.  A 95% stenosis in the PLA was treated with a DES.  A 75% stenosis in the mid LAD was treated with DES.  Moderate stenosis in the first diagonal was noted, medical treatment recommended.  He was placed on aspirin and Brilinta for 12 months without interruption.  Plavix for his previous stroke was discontinued.  Mild reduction in his EF estimated at 45 to 50% during heart catheterization.   Echocardiogram was not completed.  He tolerated the procedure well.  He was noted to have some hypokalemia and discharged on 20 mEq of K-Dur daily.  He was discharged on 12/24/2018 with cardiology follow-up.  He presents today for follow-up.  Cath site appears C/D/I (picture was grainy over video chat).  He denies chest pain. Last weekend, he was outside and stood up and became mildly lightheaded with mild nausea. He rested and sensations went away. Neighbor is an EMT - BP 110/63 and HR 80 during the episode. Assumed it was the warm weather. He has not had a recurrence. He denies lower extremity swelling, orthopnea, DOE. He is walking three times daily. He teaches online graduate classes. No bleeding on ASA and brilinta. Overall, he states he is doing very well.  Of note - he lives in [place] and would like to see Dr. Percival Spanish in [place].  The patient does not have symptoms concerning for COVID-19 infection (fever, chills, cough, or new shortness of breath).    Past Medical History:  Diagnosis Date  . Acute maxillary antritis   . Arthritis   . Diabetes mellitus without complication ([place])   . Headache   . Hyperlipidemia   . Hypertension   . Palpitations   . Stroke ([place])   . Tinnitus    Past Surgical History:  Procedure Laterality Date  . BRONCHOSCOPY    . colonscopy    . CORONARY STENT INTERVENTION N/A 12/23/2018   Procedure: CORONARY STENT INTERVENTION;  Surgeon:  Sherren Mocha, MD;  Location: [place] CV LAB;  Service: Cardiovascular;  Laterality: N/A;  RCA - MID, Distal , PL Branch and LAD - Prox  . LEFT HEART CATH AND CORONARY ANGIOGRAPHY N/A 12/23/2018   Procedure: LEFT HEART CATH AND CORONARY ANGIOGRAPHY;  Surgeon: Sherren Mocha, MD;  Location: [place] CV LAB;  Service: Cardiovascular;  Laterality: N/A;  . WISDOM TOOTH EXTRACTION       Current Meds  Medication Sig  . APPLE CIDER VINEGAR PO Take 1 capsule by mouth every morning.  . Cinnamon 500 MG capsule Take 500 mg by  mouth every morning.  . Flaxseed, Linseed, (FLAX SEED OIL) 1000 MG CAPS Take 1,000 mg by mouth every morning.  . fluticasone (FLONASE) 50 MCG/ACT nasal spray Place 1 spray into both nostrils daily as needed for allergies or rhinitis.  Marland Kitchen JARDIANCE 25 MG TABS tablet Take 25 mg by mouth every morning.   . loratadine (CLARITIN) 10 MG tablet Take 10 mg by mouth daily as needed for allergies.  . metFORMIN (GLUCOPHAGE) 500 MG tablet Take 1,000 mg by mouth 2 (two) times daily with a meal.   . metoprolol tartrate (LOPRESSOR) 25 MG tablet Take 0.5 tablets (12.5 mg total) by mouth 2 (two) times daily.  . nitroGLYCERIN (NITROSTAT) 0.4 MG SL tablet Place 1 tablet (0.4 mg total) under the tongue every 5 (five) minutes x 3 doses as needed for chest pain.  . Red Yeast Rice Extract (RED YEAST RICE PO) Take 1 capsule by mouth every morning.  . rosuvastatin (CRESTOR) 10 MG tablet Take 1 tablet (10 mg total) by mouth daily at 6 PM.  . ticagrelor (BRILINTA) 90 MG TABS tablet Take 1 tablet (90 mg total) by mouth 2 (two) times daily.  . traMADol (ULTRAM) 50 MG tablet Take 25-100 mg by mouth daily as needed for moderate pain.   . TURMERIC PO Take 1 tablet by mouth every morning.  . valsartan-hydrochlorothiazide (DIOVAN-HCT) 320-25 MG tablet Take 1 tablet by mouth every morning.      Allergies:   Other   Social History   Tobacco Use  . Smoking status: Never Smoker  . Smokeless tobacco: Never Used  Substance Use Topics  . Alcohol use: Yes    Alcohol/week: 1.0 standard drinks    Types: 1 Cans of beer per week    Comment: socially  . Drug use: No     Family Hx: The patient's family history includes Alzheimer's disease in his father; Lung cancer in his mother; Stroke in his maternal grandfather.  ROS:   Please see the history of present illness.     All other systems reviewed and are negative.   Prior CV studies:   The following studies were reviewed today:  Heart cath 12/23/18:  There is mild left  ventricular systolic dysfunction.  LV end diastolic pressure is normal.  The left ventricular ejection fraction is 45-50% by visual estimate.   1. Severe diffuse RCA and PLA stenoses treated with a 2.0x22 Resolute Onyx DES in the PLA and overlapping 3.0x30 and 3.0x26 mm Resolute Onyx DES in the mid and distal RCA 2. Severe mid-LAD stenosis treated successfully with a 3.0x15 mm Resolute Onyx DES 3. Moderate first diagonal stenosis 4. Mild segmental LV systolic dysfunction with LVEF estimated at 45-50%  Recommend: ASA/Ticagrelor x 12 months without interruption. Pt previously on clopidogrel after a stroke - change to ticagrelor in setting NSTEMI.  Labs/Other Tests and Data Reviewed:    EKG:  An ECG  dated 12/24/18 was personally reviewed today and demonstrated:  sinus rhythm, heart rate 60  Recent Labs: 12/24/2018: ALT 21; BUN 20; Creatinine, Ser 0.85; Hemoglobin 14.2; Platelets 179; Potassium 3.1; Sodium 139   Recent Lipid Panel Lab Results  Component Value Date/Time   CHOL 214 (H) 12/23/2018 07:29 AM   TRIG 210 (H) 12/23/2018 07:29 AM   HDL 41 12/23/2018 07:29 AM   CHOLHDL 5.2 12/23/2018 07:29 AM   LDLCALC 131 (H) 12/23/2018 07:29 AM    Wt Readings from Last 3 Encounters:  01/01/19 198 lb (89.8 kg)  12/23/18 213 lb 3 oz (96.7 kg)  04/10/16 225 lb 9.6 oz (102.3 kg)     Objective:    Vital Signs:  BP 121/72   Pulse 62   Ht '6\' 2"'$  (1.88 m)   Wt 198 lb (89.8 kg)   BMI 25.42 kg/m    VITAL SIGNS:  reviewed GEN:  no acute distress EYES:  sclerae anicteric, EOMI - Extraocular Movements Intact RESPIRATORY:  normal respiratory effort, symmetric expansion CARDIOVASCULAR:  no peripheral edema SKIN:  no rash, lesions or ulcers. MUSCULOSKELETAL:  no obvious deformities. NEURO:  alert and oriented x 3, no obvious focal deficit PSYCH:  normal affect  Right radial cath site C/D/I, with bruising  ASSESSMENT & PLAN:    1. CAD 2. NSTEMI with DES x 2 to RCA, DES to PLA, DES to  midLAD -ASA, Brilinta, lopressor 12.5 mg BID -CBC   3. Hypertension - valsartan-HCTZ - pressure is well-controlled   4. Hyperlipidemia with LDL goal < 70 -He did not tolerate Lipitor in the past - muscle/joint ache -Started low-dose Crestor at discharge - 10 mg -He had elevated alk phos to 130 at [facility] -AST/ALT normal -We will need repeat liver function tests - if LFTs normal, increase dose of crestor to 20 mg -If unable to tolerate Crestor, or not at LDL goal, would need lipid clinic referral for PCSK9 inhibitor therapy - 12/23/2018: Cholesterol 214; HDL 41; LDL Cholesterol 131; Triglycerides 210; VLDL 42 - repeat CMP now - repeat lipids in 6 weeks   5. Hypokalemia - k 3.1 on 12/24/18 -Discharged on 20 mEq of potassium daily with a normal serum creatinine of 0.85 - he states he was not given a prescription at discharge and has been taking OTC potassium supplements -Needs repeat CMP   6. History of stroke - plavix D/C'ed for brilinta/ASA   7. DM - uncontrolled -Metformin, Jardiance - per primary - last A1c was 7.9%   Labs: CMP CBC Fasting lipids in 6 weeks  Medications: - if LFTs are normal, will increase dose of crestor ot 20 mg  If LDL not at goal after titrating crestor - will need a lipid clinic referral   Follow up with Dr. Percival Spanish in 3 months in [place].   COVID-19 Education: The signs and symptoms of COVID-19 were discussed with the patient and how to seek care for testing (follow up with PCP or arrange E-visit).  The importance of social distancing was discussed today.  Time:   Today, I have spent 18 minutes with the patient with telehealth technology discussing the above problems.     Medication Adjustments/Labs and Tests Ordered: Current medicines are reviewed at length with the patient today.  Concerns regarding medicines are outlined above.   Tests Ordered: Orders Placed This Encounter  Procedures  . Comprehensive metabolic panel   . CBC  . Lipid panel  Paper scripts for labs were printed and sent with AVS  Medication Changes: No orders of the defined types were placed in this encounter.   Disposition:  Follow up in 3 month(s)  Signed, Ledora Bottcher, PA  01/01/2019 10:08 AM    [place]

## 2019-01-01 ENCOUNTER — Encounter: Payer: Self-pay | Admitting: Physician Assistant

## 2019-01-01 ENCOUNTER — Telehealth (INDEPENDENT_AMBULATORY_CARE_PROVIDER_SITE_OTHER): Payer: BC Managed Care – PPO | Admitting: Physician Assistant

## 2019-01-01 VITALS — BP 121/72 | HR 62 | Ht 74.0 in | Wt 198.0 lb

## 2019-01-01 DIAGNOSIS — I214 Non-ST elevation (NSTEMI) myocardial infarction: Secondary | ICD-10-CM | POA: Diagnosis not present

## 2019-01-01 DIAGNOSIS — I10 Essential (primary) hypertension: Secondary | ICD-10-CM | POA: Diagnosis not present

## 2019-01-01 DIAGNOSIS — I6350 Cerebral infarction due to unspecified occlusion or stenosis of unspecified cerebral artery: Secondary | ICD-10-CM

## 2019-01-01 DIAGNOSIS — I2510 Atherosclerotic heart disease of native coronary artery without angina pectoris: Secondary | ICD-10-CM | POA: Diagnosis not present

## 2019-01-01 DIAGNOSIS — E785 Hyperlipidemia, unspecified: Secondary | ICD-10-CM

## 2019-01-01 DIAGNOSIS — Z9861 Coronary angioplasty status: Secondary | ICD-10-CM

## 2019-01-01 NOTE — Patient Instructions (Addendum)
Medication Instructions:  Your physician recommends that you continue on your current medications as directed. Please refer to the Current Medication list given to you today.  If you need a refill on your cardiac medications before your next appointment, please call your pharmacy.   Lab work: Your physician recommends that you return for a FASTING lipid profile, CMET, CBC in 6 weeks (around July 16). There is a [facility] at the address listed below (Dr. Kathi Der office). You should not need [facility] appointment to go in for blood work. I will mail your lab slips to you with this after visit summary. Please bring both to that office to show them if they need to see it.  [facility] [address] [place],  [place]  [address]  Main: [phone]  Sunday:Closed Monday:8:00 AM - 7:00 PM Tuesday:8:00 AM - 7:00 PM Wednesday:8:00 AM - 7:00 PM Thursday:8:00 AM - 7:00 PM Friday:8:00 AM - 7:00 PM Saturday:8:00 AM - 12:00 PM   Follow-Up: At [facility], you and your health needs are our priority.  As part of our continuing mission to provide you with exceptional heart care, we have created designated Provider Care Teams.  These Care Teams include your primary Cardiologist (physician) and Advanced Practice Providers (APPs -  Physician Assistants and Nurse Practitioners) who all work together to provide you with the care you need, when you need it.  You will need a follow up appointment in 3 months.  Please call our office in advance to schedule this appointment.  You may see Todd Rotunda, MD (in [place]) or one of the following Advanced Practice Providers on your designated Care Team:   Todd An, PA-C [facility]) . Todd Reedy, PA-C [facility] Office)  Any Other Special Instructions Will Be Listed Below (If Applicable). None

## 2019-01-21 ENCOUNTER — Encounter: Payer: Self-pay | Admitting: Cardiology

## 2019-01-21 ENCOUNTER — Telehealth (INDEPENDENT_AMBULATORY_CARE_PROVIDER_SITE_OTHER): Payer: BC Managed Care – PPO | Admitting: Cardiology

## 2019-01-21 VITALS — BP 134/72 | HR 68 | Ht 74.0 in | Wt 202.0 lb

## 2019-01-21 DIAGNOSIS — I10 Essential (primary) hypertension: Secondary | ICD-10-CM

## 2019-01-21 DIAGNOSIS — E782 Mixed hyperlipidemia: Secondary | ICD-10-CM

## 2019-01-21 DIAGNOSIS — I2510 Atherosclerotic heart disease of native coronary artery without angina pectoris: Secondary | ICD-10-CM

## 2019-01-21 NOTE — Addendum Note (Signed)
Addended by: Julian Hy T on: 01/21/2019 01:58 PM   Modules accepted: Orders

## 2019-01-21 NOTE — Progress Notes (Signed)
Virtual Visit via Telephone Note   This visit type was conducted due to national recommendations for restrictions regarding the COVID-19 Pandemic (e.g. social distancing) in an effort to limit this patient's exposure and mitigate transmission in our community.  Due to his co-morbid illnesses, this patient is at least at moderate risk for complications without adequate follow up.  This format is felt to be most appropriate for this patient at this time.  The patient did not have access to video technology/had technical difficulties with video requiring transitioning to audio format only (telephone).  All issues noted in this document were discussed and addressed.  No physical exam could be performed with this format.  Please refer to the patient's chart for his  consent to telehealth for [facility].   Date:  01/21/2019   ID:  Todd Haley, DOB April 22, 1961, MRN [id number]  Patient Location: Home Provider Location: Office  PCP:  Kirstie Peri, MD  Cardiologist:  Dr Dina Rich MD Electrophysiologist:  None   Evaluation Performed:  Follow up  Chief Complaint:  Follow up  History of Present Illness:    Todd Haley is a 58 y.o. male seen today for follow up of the following medical problems.    1. NSTEMI - admitted 11/2018 with chest pain - 11/2018 cath prox LAD 75%, D1 50%, mid RCA 95%, RPL 95%. Received DES x2 to RCA/PLA, DES to LAD - compliant with meds   2. CVA - compliant with meds  3. HTN - compliant with meds  4. Hyperlipidemia - muscle aches on lipitor in the past, started on crestor 10mg  daily - tolerating crestor, has not had his repeat lipid panel and LFTs yet  5. Hypokalemia - issues during hospitatliation. Was to have repeat after discharge. He is on HCTZ   6. Dizziness - started after sitting up in bed. Got and walked downstairs, layed down on couch.  - has had some recent sinus issues.    The patient does not have symptoms concerning for COVID-19 infection  (fever, chills, cough, or new shortness of breath).    Past Medical History:  Diagnosis Date  . Acute maxillary antritis   . Arthritis   . Diabetes mellitus without complication ([facility])   . Headache   . Hyperlipidemia   . Hypertension   . Palpitations   . Stroke ([facility])   . Tinnitus    Past Surgical History:  Procedure Laterality Date  . BRONCHOSCOPY    . colonscopy    . CORONARY STENT INTERVENTION N/A 12/23/2018   Procedure: CORONARY STENT INTERVENTION;  Surgeon: Tonny Bollman, MD;  Location: [facility] INVASIVE CV LAB;  Service: Cardiovascular;  Laterality: N/A;  RCA - MID, Distal , PL Jakyrie Totherow and LAD - Prox  . LEFT HEART CATH AND CORONARY ANGIOGRAPHY N/A 12/23/2018   Procedure: LEFT HEART CATH AND CORONARY ANGIOGRAPHY;  Surgeon: Tonny Bollman, MD;  Location: [facility] INVASIVE CV LAB;  Service: Cardiovascular;  Laterality: N/A;  . WISDOM TOOTH EXTRACTION       Current Meds  Medication Sig  . APPLE CIDER VINEGAR PO Take 1 capsule by mouth every morning.  . Cinnamon 500 MG capsule Take 500 mg by mouth every morning.  . Flaxseed, Linseed, (FLAX SEED OIL) 1000 MG CAPS Take 1,000 mg by mouth every morning.  . fluticasone (FLONASE) 50 MCG/ACT nasal spray Place 1 spray into both nostrils daily as needed for allergies or rhinitis.  Marland Kitchen JARDIANCE 25 MG TABS tablet Take 25 mg by mouth every morning.   Marland Kitchen  loratadine (CLARITIN) 10 MG tablet Take 10 mg by mouth daily as needed for allergies.  . metFORMIN (GLUCOPHAGE) 500 MG tablet Take 1,000 mg by mouth 2 (two) times daily with a meal.   . metoprolol tartrate (LOPRESSOR) 25 MG tablet Take 0.5 tablets (12.5 mg total) by mouth 2 (two) times daily.  . nitroGLYCERIN (NITROSTAT) 0.4 MG SL tablet Place 1 tablet (0.4 mg total) under the tongue every 5 (five) minutes x 3 doses as needed for chest pain.  . Red Yeast Rice Extract (RED YEAST RICE PO) Take 1 capsule by mouth every morning.  . rosuvastatin (CRESTOR) 10 MG tablet Take 1 tablet (10 mg total) by mouth daily  at 6 PM.  . ticagrelor (BRILINTA) 90 MG TABS tablet Take 1 tablet (90 mg total) by mouth 2 (two) times daily.  . traMADol (ULTRAM) 50 MG tablet Take 25-100 mg by mouth daily as needed for moderate pain.   . TURMERIC PO Take 1 tablet by mouth every morning.  . valsartan-hydrochlorothiazide (DIOVAN-HCT) 320-25 MG tablet Take 1 tablet by mouth every morning.      Allergies:   Other   Social History   Tobacco Use  . Smoking status: Never Smoker  . Smokeless tobacco: Never Used  Substance Use Topics  . Alcohol use: Yes    Alcohol/week: 1.0 standard drinks    Types: 1 Cans of beer per week    Comment: socially  . Drug use: No     Family Hx: The patient's family history includes Alzheimer's disease in his father; Lung cancer in his mother; Stroke in his maternal grandfather.  ROS:   Please see the history of present illness.     All other systems reviewed and are negative.   Prior CV studies:   The following studies were reviewed today:  11/2018 cath  There is mild left ventricular systolic dysfunction.  LV end diastolic pressure is normal.  The left ventricular ejection fraction is 45-50% by visual estimate.   1. Severe diffuse RCA and PLA stenoses treated with a 2.0x22 Resolute Onyx DES in the PLA and overlapping 3.0x30 and 3.0x26 mm Resolute Onyx DES in the mid and distal RCA 2. Severe mid-LAD stenosis treated successfully with a 3.0x15 mm Resolute Onyx DES 3. Moderate first diagonal stenosis 4. Mild segmental LV systolic dysfunction with LVEF estimated at 45-50%  Recommend: ASA/Ticagrelor x 12 months without interruption. Pt previously on clopidogrel after a stroke - change to ticagrelor in setting NSTEMI.    Labs/Other Tests and Data Reviewed:    EKG:  No ECG reviewed.  Recent Labs: 12/24/2018: ALT 21; BUN 20; Creatinine, Ser 0.85; Hemoglobin 14.2; Platelets 179; Potassium 3.1; Sodium 139   Recent Lipid Panel Lab Results  Component Value Date/Time   CHOL 214  (H) 12/23/2018 07:29 AM   TRIG 210 (H) 12/23/2018 07:29 AM   HDL 41 12/23/2018 07:29 AM   CHOLHDL 5.2 12/23/2018 07:29 AM   LDLCALC 131 (H) 12/23/2018 07:29 AM    Wt Readings from Last 3 Encounters:  01/21/19 202 lb (91.6 kg)  01/01/19 198 lb (89.8 kg)  12/23/18 213 lb 3 oz (96.7 kg)     Objective:    Vital Signs:  BP 134/72   Pulse 68   Ht 6\' 2"  (1.88 m)   Wt 202 lb (91.6 kg)   BMI 25.94 kg/m    Normal affect. Normal speech pattern and tone. Comfortable, no apparent distress. No audible signs of SOB or wheezing  ASSESSMENT & PLAN:  1. CAD - no current symptoms, continue current meds   2. Hyperlipidemia - continue crestor. Repeat lipdid panel and CMET  3. HTN - reasonable control, continue current meds. May chagne HCTZ if low potassium continues to be an issue. He does report some LE edema at times if he doesn't take, may change to maxide instead or start a KCl supplement  4. Hypokalemia - repeat labs, see plan reported above  5. Dizziness - monitor at this time, somewhat mixed symptoms - if progress and seem more consistent with orthostasis, would be another reason to consider coming off HCTZ   F/u 3 months  COVID-19 Education: The signs and symptoms of COVID-19 were discussed with the patient and how to seek care for testing (follow up with PCP or arrange E-visit).  The importance of social distancing was discussed today.  Time:   Today, I have spent 15 minutes with the patient with telehealth technology discussing the above problems.     Medication Adjustments/Labs and Tests Ordered: Current medicines are reviewed at length with the patient today.  Concerns regarding medicines are outlined above.   Tests Ordered: No orders of the defined types were placed in this encounter.   Medication Changes: No orders of the defined types were placed in this encounter.   Follow Up:  Virtual Visit in 3 month(s)  Signed, Dina RichBranch, Giavonna Pflum, MD  01/21/2019 1:10  PM    [facility]

## 2019-01-21 NOTE — Patient Instructions (Signed)
Your physician recommends that you schedule a follow-up appointment in: [place]  Your physician recommends that you continue on your current medications as directed. Please refer to the Current Medication list given to you today.  Your physician recommends that you return for lab work CMP/LIPIDS/CBC - PLEASE FAST 6-8 HOURS PRIOR TO LAB WORK   Thank you for choosing [facility] [place]!!

## 2019-01-23 ENCOUNTER — Telehealth: Payer: BC Managed Care – PPO | Admitting: Cardiology

## 2019-01-26 ENCOUNTER — Other Ambulatory Visit: Payer: Self-pay | Admitting: Cardiology

## 2019-01-26 MED ORDER — METOPROLOL TARTRATE 25 MG PO TABS: 12.5000 mg | ORAL_TABLET | Freq: Two times a day (BID) | ORAL | 2 refills | Status: DC

## 2019-01-26 MED ORDER — TICAGRELOR 90 MG PO TABS: 90.0000 mg | ORAL_TABLET | Freq: Two times a day (BID) | ORAL | 2 refills | Status: DC

## 2019-01-26 NOTE — Telephone Encounter (Signed)
Please refill as patient saw Dr. Harl Bowie on 01/21/2019. Thanks!

## 2019-01-26 NOTE — Telephone Encounter (Signed)
New Message    *STAT* If patient is at the pharmacy, call can be transferred to refill team.   1. Which medications need to be refilled? (please list name of each medication and dose if known) metoprolol tartrate (LOPRESSOR) 25 MG tablet  And ticagrelor (BRILINTA) 90 MG TABS tablet    2. Which pharmacy/location (including street and city if local pharmacy) is medication to be sent to? [id number] - [place], [place] [facility] [address]   3. Do they need a 30 day or 90 day supply? [place]

## 2019-04-21 ENCOUNTER — Encounter: Payer: Self-pay | Admitting: *Deleted

## 2019-04-22 ENCOUNTER — Ambulatory Visit: Payer: BC Managed Care – PPO | Admitting: Cardiology

## 2019-04-22 ENCOUNTER — Other Ambulatory Visit: Payer: Self-pay

## 2019-04-22 ENCOUNTER — Encounter: Payer: Self-pay | Admitting: Cardiology

## 2019-04-22 VITALS — BP 137/88 | HR 86 | Ht 74.0 in | Wt 207.2 lb

## 2019-04-22 DIAGNOSIS — I10 Essential (primary) hypertension: Secondary | ICD-10-CM

## 2019-04-22 DIAGNOSIS — Z23 Encounter for immunization: Secondary | ICD-10-CM

## 2019-04-22 DIAGNOSIS — E782 Mixed hyperlipidemia: Secondary | ICD-10-CM

## 2019-04-22 DIAGNOSIS — I2510 Atherosclerotic heart disease of native coronary artery without angina pectoris: Secondary | ICD-10-CM

## 2019-04-22 NOTE — Progress Notes (Signed)
Clinical Summary Todd Haley is a 58 y.o.male seen today for follow up of the following medical problems.    1. NSTEMI - admitted 11/2018 with chest pain - 11/2018 cath prox LAD 75%, D1 50%, mid RCA 95%, RPL 95%. Received DES x2 to RCA/PLA, DES to LAD - compliant with meds  - no recent chest pain. No SOB/DOE - compliant with meds  2. CVA - compliant with meds - had been on plavix due to prior stroke.   3. HTN - compliant with meds - home bps 120s/70s.   4. Hyperlipidemia - muscle aches on lipitor in the past, started on crestor 10mg  daily - tolerating crestor, has not had his repeat lipid panel and LFTs yet  - tolerating crestror.   5. Hypokalemia - issues during hospitatliation - repeat K 3.6   6. Dizziness - started after sitting up in bed. Got and walked downstairs, layed down on couch.  - has had some recent sinus issues.   - dizziness has resolved.    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 Acute maxillary antritis   . Arthritis   . Diabetes mellitus without complication ([place])   . Headache   . Hyperlipidemia   . Hypertension   . Palpitations   . Stroke ([place])   . Tinnitus      Allergies  Allergen Reactions  . Other Hives    Peanuts and tree nuts allergy     Current Outpatient Medications  Medication Sig Dispense Refill  . APPLE CIDER VINEGAR PO Take 1 capsule by mouth every morning.    Marland Kitchen aspirin EC 81 MG tablet Take 81 mg by mouth daily.    . Cinnamon 500 MG capsule Take 500 mg by mouth every morning.    . Coenzyme Q10 (CO Q 10 PO) Take 1 capsule by mouth daily.    . Flaxseed, Linseed, (FLAX SEED OIL) 1000 MG CAPS Take 1,000 mg by mouth every morning.    . fluticasone (FLONASE) 50 MCG/ACT nasal spray Place 1 spray into both nostrils daily as needed for allergies or rhinitis.    Marland Kitchen JARDIANCE 25 MG TABS tablet Take 25 mg by mouth daily.     Marland Kitchen loratadine (CLARITIN) 10 MG tablet Take 10 mg by mouth  daily as needed for allergies.    . metFORMIN (GLUCOPHAGE) 500 MG tablet Take 1,000 mg by mouth 2 (two) times daily with a meal.     . metoprolol tartrate (LOPRESSOR) 25 MG tablet Take 0.5 tablets (12.5 mg total) by mouth 2 (two) times daily. 90 tablet 2  . nitroGLYCERIN (NITROSTAT) 0.4 MG SL tablet Place 1 tablet (0.4 mg total) under the tongue every 5 (five) minutes x 3 doses as needed for chest pain. 25 tablet 0  . Red Yeast Rice Extract (RED YEAST RICE PO) Take 1 capsule by mouth every morning.    . rosuvastatin (CRESTOR) 10 MG tablet Take 1 tablet (10 mg total) by mouth daily at 6 PM. 90 tablet 3  . ticagrelor (BRILINTA) 90 MG TABS tablet Take 1 tablet (90 mg total) by mouth 2 (two) times daily. 180 tablet 2  . traMADol (ULTRAM) 50 MG tablet Take 25-100 mg by mouth daily as needed for moderate pain.     . TURMERIC PO Take 1 tablet by mouth every morning.    . valsartan-hydrochlorothiazide (DIOVAN-HCT) 320-25 MG tablet Take 1 tablet by mouth every morning.  No current facility-administered medications for this visit.      Past Surgical History:  Procedure Laterality Date  . BRONCHOSCOPY    . colonscopy    . CORONARY STENT INTERVENTION N/A 12/23/2018   Procedure: CORONARY STENT INTERVENTION;  Surgeon: Tonny Bollman, MD;  Location: [facility] INVASIVE CV LAB;  Service: Cardiovascular;  Laterality: N/A;  RCA - MID, Distal , PL Todd Haley and LAD - Prox  . LEFT HEART CATH AND CORONARY ANGIOGRAPHY N/A 12/23/2018   Procedure: LEFT HEART CATH AND CORONARY ANGIOGRAPHY;  Surgeon: Tonny Bollman, MD;  Location: [facility] INVASIVE CV LAB;  Service: Cardiovascular;  Laterality: N/A;  . WISDOM TOOTH EXTRACTION       Allergies  Allergen Reactions  . Other Hives    Peanuts and tree nuts allergy      Family History  Problem Relation Age of Onset  . Lung cancer Mother   . Alzheimer's disease Father   . Stroke Maternal Grandfather      Social History Mr. Gervasi reports that he has never smoked. He has  never used smokeless tobacco. Mr. Baetz reports current alcohol use of about 1.0 standard drinks of alcohol per week.   Review of Systems CONSTITUTIONAL: No weight loss, fever, chills, weakness or fatigue.  HEENT: Eyes: No visual loss, blurred vision, double vision or yellow sclerae.No hearing loss, sneezing, congestion, runny nose or sore throat.  SKIN: No rash or itching.  CARDIOVASCULAR: per hpi RESPIRATORY: No shortness of breath, cough or sputum.  GASTROINTESTINAL: No anorexia, nausea, vomiting or diarrhea. No abdominal pain or blood.  GENITOURINARY: No burning on urination, no polyuria NEUROLOGICAL: No headache, dizziness, syncope, paralysis, ataxia, numbness or tingling in the extremities. No change in bowel or bladder control.  MUSCULOSKELETAL: No muscle, back pain, joint pain or stiffness.  LYMPHATICS: No enlarged nodes. No history of splenectomy.  PSYCHIATRIC: No history of depression or anxiety.  ENDOCRINOLOGIC: No reports of sweating, cold or heat intolerance. No polyuria or polydipsia.  Marland Kitchen   Physical Examination Vitals:   04/22/19 1514  BP: 137/88  Pulse: 86  SpO2: 98%   Filed Weights   04/22/19 1514  Weight: 207 lb 3.2 oz (94 kg)    Gen: resting comfortably, no acute distress HEENT: no scleral icterus, pupils equal round and reactive, no palptable cervical adenopathy,  CV: RRR, no m/r/g, no jvd Resp: Clear to auscultation bilaterally GI: abdomen is soft, non-tender, non-distended, normal bowel sounds, no hepatosplenomegaly MSK: extremities are warm, no edema.  Skin: warm, no rash Neuro:  no focal deficits Psych: appropriate affect   Diagnostic Studies 11/2018 cath  There is mild left ventricular systolic dysfunction.  LV end diastolic pressure is normal.  The left ventricular ejection fraction is 45-50% by visual estimate.  1. Severe diffuse RCA and PLA stenoses treated with a 2.0x22 Resolute Onyx DES in the PLA and overlapping 3.0x30 and 3.0x26 mm  Resolute Onyx DES in the mid and distal RCA 2. Severe mid-LAD stenosis treated successfully with a 3.0x15 mm Resolute Onyx DES 3. Moderate first diagonal stenosis 4. Mild segmental LV systolic dysfunction with LVEF estimated at 45-50%  Recommend: ASA/Ticagrelor x 12 months without interruption. Pt previously on clopidogrel after a stroke - change to ticagrelor in setting NSTEMI.    Assessment and Plan  1. CAD - doing well, continue current meds including DAPT at least until 11/2019   2. Hyperlipidemia - LDL at goal (just received labs, to be scanned in), continue crestor  3. HTN - home bp's at goal  despite mildly elevated here, continue current med - K is ok from repeat labs, if problem in the future may need to come off HCTZ. WOuld need prn diuretic as he can have some LE edema off all diuretic  F/u 6 months     Antoine Poche, M.D.,

## 2019-04-22 NOTE — Patient Instructions (Signed)

## 2019-10-04 ENCOUNTER — Ambulatory Visit: Payer: BC Managed Care – PPO | Attending: Internal Medicine

## 2019-10-04 DIAGNOSIS — Z23 Encounter for immunization: Secondary | ICD-10-CM | POA: Insufficient documentation

## 2019-10-04 NOTE — Progress Notes (Signed)
   Covid-19 Vaccination Clinic  Name:  Todd Haley    MRN: [id number] DOB: [birth year]  10/04/2019  [name] was observed post Covid-19 immunization for 15 minutes without incident. He was provided with Vaccine Information Sheet and instruction to access the V-Safe system.   [name] was instructed to call 911 with any severe reactions post vaccine: Marland Kitchen Difficulty breathing  . Swelling of face and throat  . A fast heartbeat  . A bad rash all over body  . Dizziness and weakness   Immunizations Administered    Name Date Dose VIS Date Route   Pfizer COVID-19 Vaccine 10/04/2019  1:32 PM 0.3 mL 07/10/2019 Intramuscular   Manufacturer: [employer], [employer]   Lot: [id number]   NDC: [address]-9

## 2019-10-06 ENCOUNTER — Telehealth: Payer: BC Managed Care – PPO | Admitting: Cardiology

## 2019-10-07 ENCOUNTER — Telehealth (INDEPENDENT_AMBULATORY_CARE_PROVIDER_SITE_OTHER): Payer: BC Managed Care – PPO | Admitting: Cardiology

## 2019-10-07 ENCOUNTER — Encounter: Payer: Self-pay | Admitting: Cardiology

## 2019-10-07 VITALS — BP 129/70 | HR 55 | Ht 73.5 in | Wt 215.0 lb

## 2019-10-07 DIAGNOSIS — I10 Essential (primary) hypertension: Secondary | ICD-10-CM

## 2019-10-07 DIAGNOSIS — I2510 Atherosclerotic heart disease of native coronary artery without angina pectoris: Secondary | ICD-10-CM

## 2019-10-07 DIAGNOSIS — E782 Mixed hyperlipidemia: Secondary | ICD-10-CM

## 2019-10-07 NOTE — Patient Instructions (Signed)
Your physician recommends that you schedule a follow-up appointment in: 3 MONTHS WITH DR BRANCH  Your physician recommends that you continue on your current medications as directed. Please refer to the Current Medication list given to you today.  Thank you for choosing [facility]!!    

## 2019-10-07 NOTE — Progress Notes (Signed)
Virtual Visit via Telephone Note   This visit type was conducted due to national recommendations for restrictions regarding the COVID-19 Pandemic (e.g. social distancing) in an effort to limit this patient's exposure and mitigate transmission in our community.  Due to his co-morbid illnesses, this patient is at least at moderate risk for complications without adequate follow up.  This format is felt to be most appropriate for this patient at this time.  The patient did not have access to video technology/had technical difficulties with video requiring transitioning to audio format only (telephone).  All issues noted in this document were discussed and addressed.  No physical exam could be performed with this format.  Please refer to the patient's chart for his  consent to telehealth for [facility].   The patient was identified using 2 identifiers.  Date:  10/07/2019   ID:  Janice Norrie, DOB February 01, 1961, MRN [id number]  Patient Location: Home Provider Location: Office  PCP:  Monico Blitz, MD  Cardiologist:  Carlyle Dolly, MD  Electrophysiologist:  None   Evaluation Performed:  Follow-Up Visit  Chief Complaint:  Follow up   History of Present Illness:    Issak Goley is a 59 y.o. male seen today for follow up of the following medical problems.    1. NSTEMI - admitted 11/2018 with chest pain - 11/2018 cath prox LAD 75%, D1 50%, mid RCA 95%, RPL 95%. Received DES x2 to RCA/PLA, DES to LAD - compliant with meds   - no recent chest pain. No recent SOB or DOE - compliant with meds  2. CVA - compliant with meds - had been on plavix previously due to prior stroke.   - from Dr Clydene Fake note 2017 plan was for plavix alone without ASA.   3. HTN - home bp's 120s/60s  4. Hyperlipidemia - muscle aches on lipitor in the past, started on crestor 10mg  daily  - tolerating crestror.  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  Had first [place] covid shot on Sunday.    The patient does not have symptoms concerning for COVID-19 infection (fever, chills, cough, or new shortness of breath).    Past Medical History:  Diagnosis Date  . Acute maxillary antritis   . Arthritis   . Diabetes mellitus without complication ([place])   . Headache   . Hyperlipidemia   . Hypertension   . Palpitations   . Stroke ([place])   . Tinnitus    Past Surgical History:  Procedure Laterality Date  . BRONCHOSCOPY    . colonscopy    . CORONARY STENT INTERVENTION N/A 12/23/2018   Procedure: CORONARY STENT INTERVENTION;  Surgeon: Sherren Mocha, MD;  Location: [place] CV LAB;  Service: Cardiovascular;  Laterality: N/A;  RCA - MID, Distal , PL Adora Yeh and LAD - Prox  . LEFT HEART CATH AND CORONARY ANGIOGRAPHY N/A 12/23/2018   Procedure: LEFT HEART CATH AND CORONARY ANGIOGRAPHY;  Surgeon: Sherren Mocha, MD;  Location: [place] CV LAB;  Service: Cardiovascular;  Laterality: N/A;  . WISDOM TOOTH EXTRACTION       No outpatient medications have been marked as taking for the 10/07/19 encounter (Telemedicine) with Arnoldo Lenis, MD.     Allergies:   Other   Social History   Tobacco Use  . Smoking status: Never Smoker  . Smokeless tobacco: Never Used  Substance Use Topics  . Alcohol use: Yes  Alcohol/week: 1.0 standard drinks    Types: 1 Cans of beer per week    Comment: socially  . Drug use: No     Family Hx: The patient's family history includes Alzheimer's disease in his father; Lung cancer in his mother; Stroke in his maternal grandfather.  ROS:   Please see the history of present illness.     All other systems reviewed and are negative.   Prior CV studies:   The following studies were reviewed today:  11/2018 cath  There is mild left ventricular systolic dysfunction.  LV end diastolic pressure is normal.  The left ventricular ejection fraction is 45-50% by visual estimate.  1. Severe  diffuse RCA and PLA stenoses treated with a 2.0x22 Resolute Onyx DES in the PLA and overlapping 3.0x30 and 3.0x26 mm Resolute Onyx DES in the mid and distal RCA 2. Severe mid-LAD stenosis treated successfully with a 3.0x15 mm Resolute Onyx DES 3. Moderate first diagonal stenosis 4. Mild segmental LV systolic dysfunction with LVEF estimated at 45-50%  Recommend: ASA/Ticagrelor x 12 months without interruption. Pt previously on clopidogrel after a stroke - change to ticagrelor in setting NSTEMI.   Labs/Other Tests and Data Reviewed:    EKG:  No ECG reviewed.  Recent Labs: 12/24/2018: ALT 21; BUN 20; Creatinine, Ser 0.85; Hemoglobin 14.2; Platelets 179; Potassium 3.1; Sodium 139   Recent Lipid Panel Lab Results  Component Value Date/Time   CHOL 214 (H) 12/23/2018 07:29 AM   TRIG 210 (H) 12/23/2018 07:29 AM   HDL 41 12/23/2018 07:29 AM   CHOLHDL 5.2 12/23/2018 07:29 AM   LDLCALC 131 (H) 12/23/2018 07:29 AM    Wt Readings from Last 3 Encounters:  10/07/19 215 lb (97.5 kg)  04/22/19 207 lb 3.2 oz (94 kg)  01/21/19 202 lb (91.6 kg)     Objective:    Vital Signs:  BP 129/70   Pulse (!) 55   Ht 6' 1.5" (1.867 m)   Wt 215 lb (97.5 kg)   BMI 27.98 kg/m    Normal affect. Normal speech pattern and tone. Comfortable, no apparent distress. No audible signs of SOB or wheezing.   ASSESSMENT & PLAN:    1. CAD -no symptoms, continue current meds - continue DAPT until the end of May. At that time would change back to his prior plavix he was on due to history of prior CVAs - will need refill of his NG at next appt   2. Hyperlipidemia - LDL is at goal, continue statin  3. HTN - at goal, continue current meds  F/u 6 months  COVID-19 Education: The signs and symptoms of COVID-19 were discussed with the patient and how to seek care for testing (follow up with PCP or arrange E-visit).  The importance of social distancing was discussed today.  Time:   Today, I have spent 23  minutes with the patient with telehealth technology discussing the above problems.     Medication Adjustments/Labs and Tests Ordered: Current medicines are reviewed at length with the patient today.  Concerns regarding medicines are outlined above.   Tests Ordered: No orders of the defined types were placed in this encounter.   Medication Changes: No orders of the defined types were placed in this encounter.   Follow Up:  Virtual Visit  Late May early June  Joanie Coddington, MD  10/07/2019 8:14 AM    [facility]

## 2019-10-25 ENCOUNTER — Ambulatory Visit: Payer: Self-pay | Attending: Internal Medicine

## 2019-10-25 DIAGNOSIS — Z23 Encounter for immunization: Secondary | ICD-10-CM

## 2019-10-25 NOTE — Progress Notes (Signed)
   Covid-19 Vaccination Clinic  Name:  Kenai Fluegel    MRN: [id number] DOB: 1960/11/22  10/25/2019  Mr. Catala was observed post Covid-19 immunization for 15 minutes without incident. He was provided with Vaccine Information Sheet and instruction to access the V-Safe system.   Mr. Conrad was instructed to call 911 with any severe reactions post vaccine: Marland Kitchen Difficulty breathing  . Swelling of face and throat  . A fast heartbeat  . A bad rash all over body  . Dizziness and weakness   Immunizations Administered    Name Date Dose VIS Date Route   Pfizer COVID-19 Vaccine 10/25/2019 12:13 PM 0.3 mL 07/10/2019 Intramuscular   Manufacturer: [employer], [employer]   Lot: [id number]   NDC: [address]-0

## 2019-10-28 ENCOUNTER — Other Ambulatory Visit: Payer: Self-pay | Admitting: Cardiology

## 2019-12-16 ENCOUNTER — Telehealth: Payer: Self-pay | Admitting: Cardiology

## 2019-12-16 NOTE — Telephone Encounter (Signed)
  Patient Consent for Virtual Visit         Todd Haley has provided verbal consent on 12/16/2019 for a virtual visit (video or telephone).   CONSENT FOR VIRTUAL VISIT FOR:  Todd Haley  By participating in this virtual visit I agree to the following:  I hereby voluntarily request, consent and authorize [facility] and its employed or contracted physicians, physician assistants, nurse practitioners or other licensed health care professionals (the Practitioner), to provide me with telemedicine health care services (the "Services") as deemed necessary by the treating Practitioner. I acknowledge and consent to receive the Services by the Practitioner via telemedicine. I understand that the telemedicine visit will involve communicating with the Practitioner through live audiovisual communication technology and the disclosure of certain medical information by electronic transmission. I acknowledge that I have been given the opportunity to request an in-person assessment or other available alternative prior to the telemedicine visit and am voluntarily participating in the telemedicine visit.  I understand that I have the right to withhold or withdraw my consent to the use of telemedicine in the course of my care at any time, without affecting my right to future care or treatment, and that the Practitioner or I may terminate the telemedicine visit at any time. I understand that I have the right to inspect all information obtained and/or recorded in the course of the telemedicine visit and may receive copies of available information for a reasonable fee.  I understand that some of the potential risks of receiving the Services via telemedicine include:  Marland Kitchen Delay or interruption in medical evaluation due to technological equipment failure or disruption; . Information transmitted may not be sufficient (e.g. poor resolution of images) to allow for appropriate medical decision making by the Practitioner; and/or    . In rare instances, security protocols could fail, causing a breach of personal health information.  Furthermore, I acknowledge that it is my responsibility to provide information about my medical history, conditions and care that is complete and accurate to the best of my ability. I acknowledge that Practitioner's advice, recommendations, and/or decision may be based on factors not within their control, such as incomplete or inaccurate data provided by me or distortions of diagnostic images or specimens that may result from electronic transmissions. I understand that the practice of medicine is not an exact science and that Practitioner makes no warranties or guarantees regarding treatment outcomes. I acknowledge that a copy of this consent can be made available to me via my patient portal [facility] MyChart), or I can request a printed copy by calling the office of [facility].    I understand that my insurance will be billed for this visit.   I have read or had this consent read to me. . I understand the contents of this consent, which adequately explains the benefits and risks of the Services being provided via telemedicine.  . I have been provided ample opportunity to ask questions regarding this consent and the Services and have had my questions answered to my satisfaction. . I give my informed consent for the services to be provided through the use of telemedicine in my medical care

## 2019-12-21 ENCOUNTER — Encounter: Payer: Self-pay | Admitting: *Deleted

## 2019-12-21 ENCOUNTER — Telehealth (INDEPENDENT_AMBULATORY_CARE_PROVIDER_SITE_OTHER): Payer: BC Managed Care – PPO | Admitting: Cardiology

## 2019-12-21 ENCOUNTER — Encounter: Payer: Self-pay | Admitting: Cardiology

## 2019-12-21 VITALS — BP 129/78 | HR 63 | Ht 74.0 in | Wt 207.0 lb

## 2019-12-21 DIAGNOSIS — I2510 Atherosclerotic heart disease of native coronary artery without angina pectoris: Secondary | ICD-10-CM | POA: Diagnosis not present

## 2019-12-21 DIAGNOSIS — I10 Essential (primary) hypertension: Secondary | ICD-10-CM

## 2019-12-21 DIAGNOSIS — E782 Mixed hyperlipidemia: Secondary | ICD-10-CM | POA: Diagnosis not present

## 2019-12-21 MED ORDER — CLOPIDOGREL BISULFATE 75 MG PO TABS: 75.0000 mg | ORAL_TABLET | Freq: Every day | ORAL | 1 refills | Status: DC

## 2019-12-21 NOTE — Progress Notes (Signed)
Virtual Visit via Telephone Note   This visit type was conducted due to national recommendations for restrictions regarding the COVID-19 Pandemic (e.g. social distancing) in an effort to limit this patient's exposure and mitigate transmission in our community.  Due to his co-morbid illnesses, this patient is at least at moderate risk for complications without adequate follow up.  This format is felt to be most appropriate for this patient at this time.  The patient did not have access to video technology/had technical difficulties with video requiring transitioning to audio format only (telephone).  All issues noted in this document were discussed and addressed.  No physical exam could be performed with this format.  Please refer to the patient's chart for his  consent to telehealth for [facility].   The patient was identified using 2 identifiers.  Date:  12/21/2019   ID:  Todd Haley, DOB 1960/08/16, MRN [id number]  Patient Location: Home Provider Location: Office  PCP:  Monico Blitz, MD  Cardiologist:  Carlyle Dolly, MD  Electrophysiologist:  None   Evaluation Performed:  Follow-Up Visit  Chief Complaint:  Follow up  History of Present Illness:    Todd Haley is a 59 y.o. male seen today for follow up of the following medical problems.    1. NSTEMI - admitted 11/2018 with chest pain - 11/2018 cath prox LAD 75%, D1 50%, mid RCA 95%, RPL 95%. Received DES x2 to RCA/PLA, DES to LAD. LV gram 50% - compliant with meds   - no recent chest pain, no SOB/DOE - compliant with meds  2. CVA - compliant with meds - had been on plavix previously due to prior stroke.  - from Dr Clydene Fake note 2017 plan was for plavix alone without ASA.   3. HTN - home bp's 120s/70s - compliant with meds  4. 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  Had first [place] covid shot on Sunday.  Heading to beach and [place], [place] for summer vacation   The patient does not have symptoms concerning for COVID-19 infection (fever, chills, cough, or new shortness of breath).    Past Medical History:  Diagnosis Date  . Acute maxillary antritis   . Arthritis   . Diabetes mellitus without complication ([place])   . Headache   . Hyperlipidemia   . Hypertension   . Palpitations   . Stroke ([place])   . Tinnitus    Past Surgical History:  Procedure Laterality Date  . BRONCHOSCOPY    . colonscopy    . CORONARY STENT INTERVENTION N/A 12/23/2018   Procedure: CORONARY STENT INTERVENTION;  Surgeon: Sherren Mocha, MD;  Location: [place] [facility];  Service: Cardiovascular;  Laterality: N/A;  RCA - MID, Distal , PL Zale [place] and LAD - Prox  . LEFT HEART CATH AND CORONARY ANGIOGRAPHY N/A 12/23/2018   Procedure: LEFT HEART CATH AND CORONARY ANGIOGRAPHY;  Surgeon: Sherren Mocha, MD;  Location: [place] [facility];  Service: Cardiovascular;  Laterality: N/A;  . WISDOM TOOTH EXTRACTION       Current Meds  Medication Sig  . APPLE CIDER VINEGAR PO Take 1 capsule by mouth every morning.  Marland Kitchen aspirin EC 81 MG tablet Take 81 mg by mouth daily.  Marland Kitchen BRILINTA 90 MG TABS tablet TAKE 1 TABLET (90 MG TOTAL)  BY MOUTH 2 (TWO) TIMES DAILY.  Marland Kitchen Cinnamon 500 MG capsule Take 500 mg by mouth every morning.  . Coenzyme Q10 (CO Q 10 PO) Take 1 capsule by mouth daily.  . Flaxseed, Linseed, (FLAX SEED OIL) 1000 MG CAPS Take 1,000 mg by mouth every morning.  . fluticasone (FLONASE) 50 MCG/ACT nasal spray Place 1 spray into both nostrils daily as needed for allergies or rhinitis.  Marland Kitchen glimepiride (AMARYL) 2 MG tablet Take 2 mg by mouth daily with breakfast.  . JARDIANCE 25 MG TABS tablet Take 25 mg by mouth daily.   Marland Kitchen loratadine (CLARITIN) 10 MG tablet Take 10 mg by mouth daily as needed for allergies.  . metFORMIN (GLUCOPHAGE) 500 MG  tablet Take 1,000 mg by mouth 2 (two) times daily with a meal.   . metoprolol tartrate (LOPRESSOR) 25 MG tablet TAKE 0.5 TABLETS (12.5 MG TOTAL) BY MOUTH 2 (TWO) TIMES DAILY.  . nitroGLYCERIN (NITROSTAT) 0.4 MG SL tablet Place 1 tablet (0.4 mg total) under the tongue every 5 (five) minutes x 3 doses as needed for chest pain.  . Red Yeast Rice Extract (RED YEAST RICE PO) Take 1 capsule by mouth every morning.  . rosuvastatin (CRESTOR) 10 MG tablet Take 1 tablet (10 mg total) by mouth daily at 6 PM.  . traMADol (ULTRAM) 50 MG tablet Take 25-100 mg by mouth daily as needed for moderate pain.   . TURMERIC PO Take 1 tablet by mouth every morning.  . valsartan-hydrochlorothiazide (DIOVAN-HCT) 320-25 MG tablet Take 1 tablet by mouth every morning.      Allergies:   Other   Social History   Tobacco Use  . Smoking status: Never Smoker  . Smokeless tobacco: Never Used  Substance Use Topics  . Alcohol use: Yes    Alcohol/week: 1.0 standard drinks    Types: 1 Cans of beer per week    Comment: socially  . Drug use: No     Family Hx: The patient's family history includes Alzheimer's disease in his father; Lung cancer in his mother; Stroke in his maternal grandfather.  ROS:   Please see the history of present illness.     All other systems reviewed and are negative.   Prior CV studies:   The following studies were reviewed today:   11/2018 cath  There is mild left ventricular systolic dysfunction.  LV end diastolic pressure is normal.  The left ventricular ejection fraction is 45-50% by visual estimate.  1. Severe diffuse RCA and PLA stenoses treated with a 2.0x22 Resolute Onyx DES in the PLA and overlapping 3.0x30 and 3.0x26 mm Resolute Onyx DES in the mid and distal RCA 2. Severe mid-LAD stenosis treated successfully with a 3.0x15 mm Resolute Onyx DES 3. Moderate first diagonal stenosis 4. Mild segmental LV systolic dysfunction with LVEF estimated at 45-50%  Recommend:  ASA/Ticagrelor x 12 months without interruption. Pt previously on clopidogrel after a stroke - change to ticagrelor in setting NSTEMI.  Labs/Other Tests and Data Reviewed:    EKG:  No ECG reviewed.  Recent Labs: 12/24/2018: ALT 21; BUN 20; Creatinine, Ser 0.85; Hemoglobin 14.2; Platelets 179; Potassium 3.1; Sodium 139   Recent Lipid Panel Lab Results  Component Value Date/Time   CHOL 214 (H) 12/23/2018 07:29 AM   TRIG 210 (H) 12/23/2018 07:29 AM   HDL 41 12/23/2018 07:29 AM   CHOLHDL 5.2 12/23/2018 07:29 AM   LDLCALC 131 (H) 12/23/2018 07:29 AM    Wt Readings from Last 3 Encounters:  12/21/19 207 lb (93.9 kg)  10/07/19 215 lb (97.5 kg)  04/22/19 207 lb 3.2 oz (94 kg)     Objective:    Vital Signs:  BP 129/78   Pulse 63   Ht 6\' 2"  (1.88 m)   Wt 207 lb (93.9 kg)   BMI 26.58 kg/m    Normal affect. Normal speech pattern and tone. Comfortable, no apparent distress. No audible signs of sob or wheezing.   ASSESSMENT & PLAN:    1. CAD -no symptoms - has compelted a year of DAPT. He previously was on plavix monotherapy for prior CVA prior to his MI, will change back to plavix 75mg  daily.    2. Hyperlipidemia -has been at goal, continue statin. Request pcp labs.   3. HTN -remains at goal, continue current meds  COVID-19 Education: The signs and symptoms of COVID-19 were discussed with the patient and how to seek care for testing (follow up with PCP or arrange E-visit).  The importance of social distancing was discussed today.  Time:   Today, I have spent 17 minutes with the patient with telehealth technology discussing the above problems.     Medication Adjustments/Labs and Tests Ordered: Current medicines are reviewed at length with the patient today.  Concerns regarding medicines are outlined above.   Tests Ordered: No orders of the defined types were placed in this encounter.   Medication Changes: No orders of the defined types were placed in this  encounter.   Follow Up:  In Person in 6 month(s)  Signed, , MD  12/21/2019 8:13 AM    [facility]

## 2019-12-21 NOTE — Patient Instructions (Signed)
Your physician wants you to follow-up in: 6 MONTHS WITH DR [facility] You will receive a reminder letter in the mail two months in advance. If you don't receive a letter, please call our office to schedule the follow-up appointment.  Your physician has recommended you make the following change in your medication:   STOP ASPIRIN AND BRILINTA   START PLAVIX 75 MG DAILY   Thank you for choosing [facility]!!

## 2019-12-21 NOTE — Addendum Note (Signed)
Addended by: Burman Nieves T on: 12/21/2019 08:38 AM   Modules accepted: Orders

## 2020-06-24 ENCOUNTER — Other Ambulatory Visit: Payer: Self-pay | Admitting: Cardiology

## 2020-06-28 ENCOUNTER — Ambulatory Visit: Payer: BC Managed Care – PPO | Admitting: Cardiology

## 2020-07-17 NOTE — Progress Notes (Signed)
Cardiology Office Note  Date: 07/18/2020   ID: Todd Haley, DOB [birth year], MRN [id number]  PCP:  Kirstie Peri, MD  Cardiologist:  Dina Rich, MD Electrophysiologist:  None   Chief Complaint: Cardiac follow up  History of Present Illness: Todd Haley is a 59 y.o. male with a history of CAD/ MI (DES x 2 RCA/PLA, DES-LAD 11/2018), HTN, HLD,DM2,CVA.    Last encounter 12/21/2019 Dr Wyline Mood. No recent CP/SOB/DOE. Previously on plavix for CVA without ASA. Compliant with BP medication and BP well controlled. Previous muscle aches on Lipitor.  Started on crestor without symptoms. Plavix continued at 75 mg po daily. Lipids at goal. Continuing statin. BP at goal and continuing medication.   He is here today for 28-month follow-up. He denies any recent acute illnesses or hospitalizations since last visit. States he is received the Covid vaccine. He is requesting a flu vaccine today while he is here. States he is up in quite a while since he has had any lab work and is requesting that we do perform basic lab work and have it sent to his primary care provider's office. He denies any anginal or exertional symptoms, palpitations or arrhythmias, orthostatic symptoms, CVA or TIA-like symptoms, PND, orthopnea, bleeding, claudication-like symptoms, DVT or PE-like symptoms, or lower extremity edema. EKG today shows normal sinus rhythm with a rate of 62. Blood pressure is elevated. However he states his blood pressure is normally 130s over 70s at home.   Past Medical History:  Diagnosis Date   Acute maxillary antritis    Arthritis    Diabetes mellitus without complication (HCC)    Headache    Hyperlipidemia    Hypertension    Palpitations    Stroke [facility])    Tinnitus     Past Surgical History:  Procedure Laterality Date   BRONCHOSCOPY     colonscopy     CORONARY STENT INTERVENTION N/A 12/23/2018   Procedure: CORONARY STENT INTERVENTION;  Surgeon: Tonny Bollman, MD;  Location: [facility]  INVASIVE CV LAB;  Service: Cardiovascular;  Laterality: N/A;  RCA - MID, Distal , PL Branch and LAD - Prox   LEFT HEART CATH AND CORONARY ANGIOGRAPHY N/A 12/23/2018   Procedure: LEFT HEART CATH AND CORONARY ANGIOGRAPHY;  Surgeon: Tonny Bollman, MD;  Location: [facility] INVASIVE CV LAB;  Service: Cardiovascular;  Laterality: N/A;   WISDOM TOOTH EXTRACTION      Current Outpatient Medications  Medication Sig Dispense Refill   APPLE CIDER VINEGAR PO Take 1 capsule by mouth every morning.     Cinnamon 500 MG capsule Take 500 mg by mouth every morning.     clopidogrel (PLAVIX) 75 MG tablet TAKE 1 TABLET BY MOUTH EVERY DAY 90 tablet 1   Coenzyme Q10 (CO Q 10 PO) Take 1 capsule by mouth daily.     Flaxseed, Linseed, (FLAX SEED OIL) 1000 MG CAPS Take 1,000 mg by mouth every morning.     fluticasone (FLONASE) 50 MCG/ACT nasal spray Place 1 spray into both nostrils daily as needed for allergies or rhinitis.     glimepiride (AMARYL) 2 MG tablet Take 2 mg by mouth daily with breakfast.     JARDIANCE 25 MG TABS tablet Take 25 mg by mouth daily.      loratadine (CLARITIN) 10 MG tablet Take 10 mg by mouth daily as needed for allergies.     metFORMIN (GLUCOPHAGE) 500 MG tablet Take 1,000 mg by mouth 2 (two) times daily with a meal.      metoprolol  tartrate (LOPRESSOR) 25 MG tablet TAKE 0.5 TABLETS (12.5 MG TOTAL) BY MOUTH 2 (TWO) TIMES DAILY. 90 tablet 2   nitroGLYCERIN (NITROSTAT) 0.4 MG SL tablet Place 1 tablet (0.4 mg total) under the tongue every 5 (five) minutes x 3 doses as needed for chest pain. 25 tablet 0   Red Yeast Rice Extract (RED YEAST RICE PO) Take 1 capsule by mouth every morning.     rosuvastatin (CRESTOR) 10 MG tablet Take 1 tablet (10 mg total) by mouth daily at 6 PM. 90 tablet 3   traMADol (ULTRAM) 50 MG tablet Take 25-100 mg by mouth daily as needed for moderate pain.      TURMERIC PO Take 1 tablet by mouth every morning.     valsartan-hydrochlorothiazide (DIOVAN-HCT) 320-25  MG tablet Take 1 tablet by mouth every morning.      No current facility-administered medications for this visit.   Allergies:  Other   Social History: The patient  reports that he has never smoked. He has never used smokeless tobacco. He reports current alcohol use of about 1.0 standard drink of alcohol per week. He reports that he does not use drugs.   Family History: The patient's family history includes Alzheimer's disease in his father; Lung cancer in his mother; Stroke in his maternal grandfather.   ROS:  Please see the history of present illness. Otherwise, complete review of systems is positive for none.  All other systems are reviewed and negative.   Physical Exam: VS:  BP (!) 160/88    Pulse 68    Ht 6\' 2"  (1.88 m)    Wt 235 lb (106.6 kg)    SpO2 98%    BMI 30.17 kg/m , BMI Body mass index is 30.17 kg/m.  Wt Readings from Last 3 Encounters:  07/18/20 235 lb (106.6 kg)  12/21/19 207 lb (93.9 kg)  10/07/19 215 lb (97.5 kg)    General: Patient appears comfortable at rest. Neck: Supple, no elevated JVP or carotid bruits, no thyromegaly. Lungs: Clear to auscultation, nonlabored breathing at rest. Cardiac: Regular rate and rhythm, no S3 or significant systolic murmur, no pericardial rub. Extremities: No pitting edema, distal pulses 2+. Skin: Warm and dry. Musculoskeletal: No kyphosis. Neuropsychiatric: Alert and oriented x3, affect grossly appropriate.  ECG:  An ECG dated 07/18/2020 was personally reviewed today and demonstrated:  Normal sinus rhythm rate of 62.  Recent Labwork: No results found for requested labs within last 8760 hours.     Component Value Date/Time   CHOL 214 (H) 12/23/2018 0729   TRIG 210 (H) 12/23/2018 0729   HDL 41 12/23/2018 0729   CHOLHDL 5.2 12/23/2018 0729   VLDL 42 (H) 12/23/2018 0729   LDLCALC 131 (H) 12/23/2018 0729    Other Studies Reviewed Today:   11/2018 cath  There is mild left ventricular systolic dysfunction.  LV end diastolic  pressure is normal.  The left ventricular ejection fraction is 45-50% by visual estimate.  1. Severe diffuse RCA and PLA stenoses treated with a 2.0x22 Resolute Onyx DES in the PLA and overlapping 3.0x30 and 3.0x26 mm Resolute Onyx DES in the mid and distal RCA 2. Severe mid-LAD stenosis treated successfully with a 3.0x15 mm Resolute Onyx DES 3. Moderate first diagonal stenosis 4. Mild segmental LV systolic dysfunction with LVEF estimated at 45-50%  Recommend: ASA/Ticagrelor x 12 months without interruption. Pt previously on clopidogrel after a stroke - change to ticagrelor in setting NSTEMI  Assessment and Plan:  1. CAD in native  artery   2. Mixed hyperlipidemia   3. Essential hypertension, benign    1. CAD in native artery Has any anginal or exertional symptoms. Continue Plavix 75 mg daily. Continue nitroglycerin sublingual as needed. Continue metoprolol 12.5 mg p.o. twice daily.  2. Mixed hyperlipidemia Continue Crestor 10 mg daily. Please order fasting lipid panel.  3. Essential hypertension, benign  Blood pressure is elevated today on arrival. Patient states his blood pressure at home is usually 130s over 70s. Continue metoprolol 12.5 mg p.o. twice daily. Continue Diovan 320/25 mg p.o. daily. Patient states has been quite sometime since he had any basic lab work. He is requesting that we draw the lab work. Please get a CBC and basic metabolic panel.   Medication Adjustments/Labs and Tests Ordered: Current medicines are reviewed at length with the patient today.  Concerns regarding medicines are outlined above.   Disposition: Follow-up with Branch or APP 6 months  Signed, Rennis Harding, NP 07/18/2020 8:56 AM    [facility] at [facility] [address] [place], [place], [place] [address] Phone: [phone]; Fax: [phone]

## 2020-07-18 ENCOUNTER — Encounter: Payer: Self-pay | Admitting: Family Medicine

## 2020-07-18 ENCOUNTER — Ambulatory Visit: Payer: BC Managed Care – PPO | Admitting: Family Medicine

## 2020-07-18 VITALS — BP 160/88 | HR 68 | Ht 74.0 in | Wt 235.0 lb

## 2020-07-18 DIAGNOSIS — I10 Essential (primary) hypertension: Secondary | ICD-10-CM | POA: Diagnosis not present

## 2020-07-18 DIAGNOSIS — I2510 Atherosclerotic heart disease of native coronary artery without angina pectoris: Secondary | ICD-10-CM | POA: Diagnosis not present

## 2020-07-18 DIAGNOSIS — E782 Mixed hyperlipidemia: Secondary | ICD-10-CM

## 2020-07-18 NOTE — Patient Instructions (Signed)
Your physician wants you to follow-up in: 6 MONTHS WITH DR [facility] You will receive a reminder letter in the mail two months in advance. If you don't receive a letter, please call our office to schedule the follow-up appointment.  Your physician recommends that you continue on your current medications as directed. Please refer to the Current Medication list given to you today.  Your physician recommends that you return for lab work BMP/MG/CBC/LIPIDS - PLEASE FAST 6-8 HOURS PRIOR TO LAB WORK   Thank you for choosing [facility]!!

## 2020-08-07 ENCOUNTER — Other Ambulatory Visit: Payer: Self-pay | Admitting: Cardiology

## 2020-10-27 ENCOUNTER — Telehealth: Payer: Self-pay | Admitting: *Deleted

## 2020-10-27 NOTE — Telephone Encounter (Signed)
Ok to hold plavix and proceed with surgery  Dominga Ferry MD

## 2020-10-27 NOTE — Telephone Encounter (Signed)
   Primary Cardiologist: Dina Rich, MD  Chart reviewed as part of pre-operative protocol coverage. Given past medical history and time since last visit, based on ACC/AHA guidelines, Khan Chura would be at acceptable risk for the planned procedure without further cardiovascular testing.   His Plavix may be held for 5 days prior to his procedure.  Please resume as soon as hemostasis is achieved at the discretion of the surgeon.  Patient was advised that if he develops new symptoms prior to surgery to contact our office to arrange a follow-up appointment.  He verbalized understanding.  I will route this recommendation to the requesting party via Epic fax function and remove from pre-op pool.  Please call with questions.  Thomasene Ripple. Promise Weldin NP-C    10/27/2020, 12:57 PM [facility] [address] Office [phone] Fax [phone]

## 2020-10-27 NOTE — Telephone Encounter (Signed)
    [facility] Pre-operative Risk Assessment    HEARTCARE STAFF: - Please ensure there is not already an duplicate clearance open for this procedure. - Under Visit Info/Reason for Call, type in Other and utilize the format Clearance MM/DD/YY or Clearance TBD. Do not use dashes or single digits. - If request is for dental extraction, please clarify the # of teeth to be extracted.  Request for surgical clearance:  1. What type of surgery is being performed? Right total hip arthroplasty  2. When is this surgery scheduled? 12/21/2020  3. What type of clearance is required (medical clearance vs. Pharmacy clearance to hold med vs. Both)? both  4. Are there any medications that need to be held prior to surgery and how long? plavix  5. Practice name and name of physician performing surgery? [facility]/Dr. Lyla Glassing  6. What is the office phone number? [phone]   7.   What is the office fax number? [phone]  8.   Anesthesia type (None, local, MAC, general) ? spinal   Marlou Sa 10/27/2020, 8:09 AM  _________________________________________________________________   Attention: Glendale Chard

## 2020-10-27 NOTE — Telephone Encounter (Signed)
Todd Haley 60 year old male is requesting clearance for right total hip arthroplasty.  He was last seen in the clinic on 07/18/2020 by Nena Polio, NP.  He was doing well at that time and had no cardiac complaints.  Follow-up was planned for 6 months.  His PMH includes coronary artery disease/MI with DES x2 to RCA/PLA, DES-LAD 11/2018, hypertension, hyperlipidemia, diabetes mellitus type 2, and CVA.  May his Plavix be held prior to his surgery?  Thank you for your help.  Please direct response to CV DIV preop for.  Thomasene Ripple. Lusero Nordlund NP-C    10/27/2020, 8:39 AM [facility] [address] Office [phone] Fax [phone]

## 2020-10-31 ENCOUNTER — Ambulatory Visit: Payer: Self-pay | Admitting: Student

## 2020-12-05 ENCOUNTER — Ambulatory Visit: Payer: Self-pay | Admitting: Student

## 2020-12-05 NOTE — H&P (Signed)
TOTAL HIP ADMISSION H&P  Patient is admitted for right total hip arthroplasty.  Subjective:  Chief Complaint: right hip pain  HPI: Todd Haley, 60 y.o. male, has a history of pain and functional disability in the right hip(s) due to arthritis and patient has failed non-surgical conservative treatments for greater than 12 weeks to include NSAID's and/or analgesics and activity modification.  Onset of symptoms was gradual starting >10 years ago with gradually worsening course since that time.The patient noted no past surgery on the right hip(s).  Patient currently rates pain in the right hip at 8 out of 10 with activity. Patient has worsening of pain with activity and weight bearing, pain that interfers with activities of daily living and pain with passive range of motion. Patient has evidence of subchondral cysts, subchondral sclerosis and joint space narrowing by imaging studies. This condition presents safety issues increasing the risk of falls. There is no current active infection.  Patient Active Problem List   Diagnosis Date Noted  . HLD (hyperlipidemia) 12/24/2018  . CAD S/P percutaneous coronary angioplasty 12/24/2018  . Hypokalemia 12/24/2018  . NSTEMI (non-ST elevated myocardial infarction) (HCC) 12/23/2018  . Right pontine cerebrovascular accident (HCC) 04/10/2016  . Left hemiplegia (HCC) 04/10/2016  . Essential hypertension, benign 04/10/2016  . Diabetes (HCC) 04/10/2016   Past Medical History:  Diagnosis Date  . Acute maxillary antritis   . Arthritis   . Diabetes mellitus without complication (HCC)   . Headache   . Hyperlipidemia   . Hypertension   . Palpitations   . Stroke (HCC)   . Tinnitus     Past Surgical History:  Procedure Laterality Date  . BRONCHOSCOPY    . colonscopy    . CORONARY STENT INTERVENTION N/A 12/23/2018   Procedure: CORONARY STENT INTERVENTION;  Surgeon: Tonny Bollman, MD;  Location: [facility] INVASIVE CV LAB;  Service: Cardiovascular;  Laterality: N/A;   RCA - MID, Distal , PL Branch and LAD - Prox  . LEFT HEART CATH AND CORONARY ANGIOGRAPHY N/A 12/23/2018   Procedure: LEFT HEART CATH AND CORONARY ANGIOGRAPHY;  Surgeon: Tonny Bollman, MD;  Location: [facility] INVASIVE CV LAB;  Service: Cardiovascular;  Laterality: N/A;  . WISDOM TOOTH EXTRACTION      Current Outpatient Medications  Medication Sig Dispense Refill Last Dose  . APPLE CIDER VINEGAR PO Take 1 capsule by mouth every morning.     . Cinnamon 500 MG capsule Take 500 mg by mouth every morning.     . clopidogrel (PLAVIX) 75 MG tablet TAKE 1 TABLET BY MOUTH EVERY DAY 90 tablet 1   . Coenzyme Q10 (CO Q 10 PO) Take 1 capsule by mouth daily.     . Flaxseed, Linseed, (FLAX SEED OIL) 1000 MG CAPS Take 1,000 mg by mouth every morning.     . fluticasone (FLONASE) 50 MCG/ACT nasal spray Place 1 spray into both nostrils daily as needed for allergies or rhinitis.     Marland Kitchen glimepiride (AMARYL) 2 MG tablet Take 2 mg by mouth daily with breakfast.     . JARDIANCE 25 MG TABS tablet Take 25 mg by mouth daily.      Marland Kitchen loratadine (CLARITIN) 10 MG tablet Take 10 mg by mouth daily as needed for allergies.     . metFORMIN (GLUCOPHAGE) 500 MG tablet Take 1,000 mg by mouth 2 (two) times daily with a meal.      . metoprolol tartrate (LOPRESSOR) 25 MG tablet TAKE 1/2 TABLET BY MOUTH TWICE DAILY 90 tablet 2   .  nitroGLYCERIN (NITROSTAT) 0.4 MG SL tablet Place 1 tablet (0.4 mg total) under the tongue every 5 (five) minutes x 3 doses as needed for chest pain. 25 tablet 0   . Red Yeast Rice Extract (RED YEAST RICE PO) Take 1 capsule by mouth every morning.     . rosuvastatin (CRESTOR) 10 MG tablet Take 1 tablet (10 mg total) by mouth daily at 6 PM. 90 tablet 3   . traMADol (ULTRAM) 50 MG tablet Take 25-100 mg by mouth daily as needed for moderate pain.      . TURMERIC PO Take 1 tablet by mouth every morning.     . valsartan-hydrochlorothiazide (DIOVAN-HCT) 320-25 MG tablet Take 1 tablet by mouth every morning.       No  current facility-administered medications for this visit.   Allergies  Allergen Reactions  . Other Hives    Peanuts and tree nuts allergy    Social History   Tobacco Use  . Smoking status: Never Smoker  . Smokeless tobacco: Never Used  Substance Use Topics  . Alcohol use: Yes    Alcohol/week: 1.0 standard drink    Types: 1 Cans of beer per week    Comment: socially    Family History  Problem Relation Age of Onset  . Lung cancer Mother   . Alzheimer's disease Father   . Stroke Maternal Grandfather      Review of Systems  Musculoskeletal: Positive for arthralgias and back pain.  All other systems reviewed and are negative.   Objective:  Physical Exam Constitutional:      Appearance: Normal appearance.  HENT:     Head: Normocephalic.  Eyes:     Pupils: Pupils are equal, round, and reactive to light.  Cardiovascular:     Rate and Rhythm: Normal rate.     Pulses: Normal pulses.  Pulmonary:     Effort: Pulmonary effort is normal.  Abdominal:     Palpations: Abdomen is soft.     Tenderness: There is no abdominal tenderness.  Genitourinary:    Comments: Deferred Musculoskeletal:     Cervical back: Normal range of motion.     Comments: Examination of right hip reveals no skin wounds or lesions. Pain with terminal flexion and rotation. The patient has severely restricted range of motion. No tenderness to palpation over the greater trochanter  Skin:    General: Skin is warm and dry.  Neurological:     Mental Status: He is alert and oriented to person, place, and time.  Psychiatric:        Mood and Affect: Mood normal.     Vital signs in last 24 hours: @VSRANGES @  Labs:   Estimated body mass index is 30.17 kg/m as calculated from the following:   Height as of 07/18/20: 6\' 2"  (1.88 m).   Weight as of 07/18/20: 106.6 kg.   Imaging Review Plain radiographs demonstrate severe degenerative joint disease of the bilateral hip(s). The bone quality appears to be  adequate for age and reported activity level.      Assessment/Plan:  End stage arthritis, right hip(s)  The patient history, physical examination, clinical judgement of the provider and imaging studies are consistent with end stage degenerative joint disease of the right hip(s) and total hip arthroplasty is deemed medically necessary. The treatment options including medical management, injection therapy, arthroscopy and arthroplasty were discussed at length. The risks and benefits of total hip arthroplasty were presented and reviewed. The risks due to aseptic loosening, infection, stiffness,  dislocation/subluxation,  thromboembolic complications and other imponderables were discussed.  The patient acknowledged the explanation, agreed to proceed with the plan and consent was signed. Patient is being admitted for inpatient treatment for surgery, pain control, PT, OT, prophylactic antibiotics, VTE prophylaxis, progressive ambulation and ADL's and discharge planning.The patient is planning to be discharged home after overnight observation

## 2020-12-05 NOTE — H&P (View-Only) (Signed)
TOTAL HIP ADMISSION H&P  Patient is admitted for right total hip arthroplasty.  Subjective:  Chief Complaint: right hip pain  HPI: Todd Haley, 60 y.o. male, has a history of pain and functional disability in the right hip(s) due to arthritis and patient has failed non-surgical conservative treatments for greater than 12 weeks to include NSAID's and/or analgesics and activity modification.  Onset of symptoms was gradual starting >10 years ago with gradually worsening course since that time.The patient noted no past surgery on the right hip(s).  Patient currently rates pain in the right hip at 8 out of 10 with activity. Patient has worsening of pain with activity and weight bearing, pain that interfers with activities of daily living and pain with passive range of motion. Patient has evidence of subchondral cysts, subchondral sclerosis and joint space narrowing by imaging studies. This condition presents safety issues increasing the risk of falls. There is no current active infection.  Patient Active Problem List   Diagnosis Date Noted  . HLD (hyperlipidemia) 12/24/2018  . CAD S/P percutaneous coronary angioplasty 12/24/2018  . Hypokalemia 12/24/2018  . NSTEMI (non-ST elevated myocardial infarction) (HCC) 12/23/2018  . Right pontine cerebrovascular accident (HCC) 04/10/2016  . Left hemiplegia (HCC) 04/10/2016  . Essential hypertension, benign 04/10/2016  . Diabetes (HCC) 04/10/2016   Past Medical History:  Diagnosis Date  . Acute maxillary antritis   . Arthritis   . Diabetes mellitus without complication (HCC)   . Headache   . Hyperlipidemia   . Hypertension   . Palpitations   . Stroke (HCC)   . Tinnitus     Past Surgical History:  Procedure Laterality Date  . BRONCHOSCOPY    . colonscopy    . CORONARY STENT INTERVENTION N/A 12/23/2018   Procedure: CORONARY STENT INTERVENTION;  Surgeon: Tonny Bollman, MD;  Location: [facility] INVASIVE CV LAB;  Service: Cardiovascular;  Laterality: N/A;   RCA - MID, Distal , PL Branch and LAD - Prox  . LEFT HEART CATH AND CORONARY ANGIOGRAPHY N/A 12/23/2018   Procedure: LEFT HEART CATH AND CORONARY ANGIOGRAPHY;  Surgeon: Tonny Bollman, MD;  Location: [facility] INVASIVE CV LAB;  Service: Cardiovascular;  Laterality: N/A;  . WISDOM TOOTH EXTRACTION      Current Outpatient Medications  Medication Sig Dispense Refill Last Dose  . APPLE CIDER VINEGAR PO Take 1 capsule by mouth every morning.     . Cinnamon 500 MG capsule Take 500 mg by mouth every morning.     . clopidogrel (PLAVIX) 75 MG tablet TAKE 1 TABLET BY MOUTH EVERY DAY 90 tablet 1   . Coenzyme Q10 (CO Q 10 PO) Take 1 capsule by mouth daily.     . Flaxseed, Linseed, (FLAX SEED OIL) 1000 MG CAPS Take 1,000 mg by mouth every morning.     . fluticasone (FLONASE) 50 MCG/ACT nasal spray Place 1 spray into both nostrils daily as needed for allergies or rhinitis.     Marland Kitchen glimepiride (AMARYL) 2 MG tablet Take 2 mg by mouth daily with breakfast.     . JARDIANCE 25 MG TABS tablet Take 25 mg by mouth daily.      Marland Kitchen loratadine (CLARITIN) 10 MG tablet Take 10 mg by mouth daily as needed for allergies.     . metFORMIN (GLUCOPHAGE) 500 MG tablet Take 1,000 mg by mouth 2 (two) times daily with a meal.      . metoprolol tartrate (LOPRESSOR) 25 MG tablet TAKE 1/2 TABLET BY MOUTH TWICE DAILY 90 tablet 2   .  nitroGLYCERIN (NITROSTAT) 0.4 MG SL tablet Place 1 tablet (0.4 mg total) under the tongue every 5 (five) minutes x 3 doses as needed for chest pain. 25 tablet 0   . Red Yeast Rice Extract (RED YEAST RICE PO) Take 1 capsule by mouth every morning.     . rosuvastatin (CRESTOR) 10 MG tablet Take 1 tablet (10 mg total) by mouth daily at 6 PM. 90 tablet 3   . traMADol (ULTRAM) 50 MG tablet Take 25-100 mg by mouth daily as needed for moderate pain.      . TURMERIC PO Take 1 tablet by mouth every morning.     . valsartan-hydrochlorothiazide (DIOVAN-HCT) 320-25 MG tablet Take 1 tablet by mouth every morning.       No  current facility-administered medications for this visit.   Allergies  Allergen Reactions  . Other Hives    Peanuts and tree nuts allergy    Social History   Tobacco Use  . Smoking status: Never Smoker  . Smokeless tobacco: Never Used  Substance Use Topics  . Alcohol use: Yes    Alcohol/week: 1.0 standard drink    Types: 1 Cans of beer per week    Comment: socially    Family History  Problem Relation Age of Onset  . Lung cancer Mother   . Alzheimer's disease Father   . Stroke Maternal Grandfather      Review of Systems  Musculoskeletal: Positive for arthralgias and back pain.  All other systems reviewed and are negative.   Objective:  Physical Exam Constitutional:      Appearance: Normal appearance.  HENT:     Head: Normocephalic.  Eyes:     Pupils: Pupils are equal, round, and reactive to light.  Cardiovascular:     Rate and Rhythm: Normal rate.     Pulses: Normal pulses.  Pulmonary:     Effort: Pulmonary effort is normal.  Abdominal:     Palpations: Abdomen is soft.     Tenderness: There is no abdominal tenderness.  Genitourinary:    Comments: Deferred Musculoskeletal:     Cervical back: Normal range of motion.     Comments: Examination of right hip reveals no skin wounds or lesions. Pain with terminal flexion and rotation. The patient has severely restricted range of motion. No tenderness to palpation over the greater trochanter  Skin:    General: Skin is warm and dry.  Neurological:     Mental Status: He is alert and oriented to person, place, and time.  Psychiatric:        Mood and Affect: Mood normal.     Vital signs in last 24 hours: @VSRANGES @  Labs:   Estimated body mass index is 30.17 kg/m as calculated from the following:   Height as of 07/18/20: 6\' 2"  (1.88 m).   Weight as of 07/18/20: 106.6 kg.   Imaging Review Plain radiographs demonstrate severe degenerative joint disease of the bilateral hip(s). The bone quality appears to be  adequate for age and reported activity level.      Assessment/Plan:  End stage arthritis, right hip(s)  The patient history, physical examination, clinical judgement of the provider and imaging studies are consistent with end stage degenerative joint disease of the right hip(s) and total hip arthroplasty is deemed medically necessary. The treatment options including medical management, injection therapy, arthroscopy and arthroplasty were discussed at length. The risks and benefits of total hip arthroplasty were presented and reviewed. The risks due to aseptic loosening, infection, stiffness,  dislocation/subluxation,  thromboembolic complications and other imponderables were discussed.  The patient acknowledged the explanation, agreed to proceed with the plan and consent was signed. Patient is being admitted for inpatient treatment for surgery, pain control, PT, OT, prophylactic antibiotics, VTE prophylaxis, progressive ambulation and ADL's and discharge planning.The patient is planning to be discharged home after overnight observation

## 2020-12-13 NOTE — Patient Instructions (Addendum)
DUE TO COVID-19 ONLY ONE VISITOR IS ALLOWED TO COME WITH YOU AND STAY IN THE WAITING ROOM ONLY DURING PRE OP AND PROCEDURE DAY OF SURGERY. THE 2 VISITORS  MAY VISIT WITH YOU AFTER SURGERY IN YOUR PRIVATE ROOM DURING VISITING HOURS ONLY!     Get your covid test on5/23   At 9:50_____, THIS TEST MUST BE DONE BEFORE SURGERY,  COVID TESTING SITE [address] [place] [address], IT IS ON THE RIGHT GOING OUT [address] APPROXIMATELY  2 MINUTES PAST [employer] ON THE RIGHT. ONCE YOUR COVID TEST IS COMPLETED,  PLEASE BEGIN THE QUARANTINE INSTRUCTIONS AS OUTLINED IN YOUR HANDOUT.                Jens Som    Your procedure is scheduled on: 12/21/20   Report to [facility] Main  Entrance   Report to Short stay at 5:15 AM     Call this number if you have problems the morning of surgery [phone]   . BRUSH YOUR TEETH MORNING OF SURGERY AND RINSE YOUR MOUTH OUT, NO CHEWING GUM CANDY OR MINTS.  No food after midnight.    At 4:00 AM drink pre surgery G2 drink.   Nothing by mouth after 4:30 AM.    Take these medicines the morning of surgery with A SIP OF WATER: Metoprolol  DO NOT TAKE ANY DIABETIC MEDICATIONS DAY OF YOUR SURGERY                               You may not have any metal on your body including               piercings  Do not wear jewelry,  lotions, powders or deodorant              Men may shave face and neck.   Do not bring valuables to the hospital. [place] IS NOT             RESPONSIBLE   FOR VALUABLES.  Contacts, dentures or bridgework may not be worn into surgery.      Patients discharged the day of surgery will not be allowed to drive home.   IF YOU ARE HAVING SURGERY AND GOING HOME THE SAME DAY, YOU MUST HAVE AN ADULT TO DRIVE YOU HOME AND BE WITH YOU FOR 24 HOURS.  YOU MAY GO HOME BY TAXI OR UBER OR ORTHERWISE, BUT AN ADULT MUST ACCOMPANY YOU HOME AND STAY WITH YOU FOR 24 HOURS.  Name and phone number of your  driver:  Special Instructions: N/A              Please read over the following fact sheets you were given: _____________________________________________________________________             [facility] - Preparing for Surgery Before surgery, you can play an important role.  Because skin is not sterile, your skin needs to be as free of germs as possible.  You can reduce the number of germs on your skin by washing with CHG (chlorahexidine gluconate) soap before surgery.  CHG is an antiseptic cleaner which kills germs and bonds with the skin to continue killing germs even after washing. Please DO NOT use if you have an allergy to CHG or antibacterial soaps.  If your skin becomes reddened/irritated stop using the CHG and inform your nurse when you arrive at Short Stay. .  You  may shave your face/neck.  Please follow these instructions carefully:  1.  Shower with CHG Soap the night before surgery and the  morning of Surgery.  2.  If you choose to wash your hair, wash your hair first as usual with your  normal  shampoo.  3.  After you shampoo, rinse your hair and body thoroughly to remove the  shampoo.                                        4.  Use CHG as you would any other liquid soap.  You can apply chg directly  to the skin and wash                       Gently with a scrungie or clean washcloth.  5.  Apply the CHG Soap to your body ONLY FROM THE NECK DOWN.   Do not use on face/ open                           Wound or open sores. Avoid contact with eyes, ears mouth and genitals (private parts).                       Wash face,  Genitals (private parts) with your normal soap.             6.  Wash thoroughly, paying special attention to the area where your surgery  will be performed.  7.  Thoroughly rinse your body with warm water from the neck down.  8.  DO NOT shower/wash with your normal soap after using and rinsing off  the CHG Soap.             9.  Pat yourself dry with a clean towel.             10.  Wear clean pajamas.            11.  Place clean sheets on your bed the night of your first shower and do not  sleep with pets. Day of Surgery : Do not apply any lotions/deodorants the morning of surgery.  Please wear clean clothes to the hospital/surgery center.  FAILURE TO FOLLOW THESE INSTRUCTIONS MAY RESULT IN THE CANCELLATION OF YOUR SURGERY PATIENT SIGNATURE_________________________________  NURSE SIGNATURE__________________________________  ________________________________________________________________________   Rogelia Mire  An incentive spirometer is a tool that can help keep your lungs clear and active. This tool measures how well you are filling your lungs with each breath. Taking long deep breaths may help reverse or decrease the chance of developing breathing (pulmonary) problems (especially infection) following:  A long period of time when you are unable to move or be active. BEFORE THE PROCEDURE   If the spirometer includes an indicator to show your best effort, your nurse or respiratory therapist will set it to a desired goal.  If possible, sit up straight or lean slightly forward. Try not to slouch.  Hold the incentive spirometer in an upright position. INSTRUCTIONS FOR USE  1. Sit on the edge of your bed if possible, or sit up as far as you can in bed or on a chair. 2. Hold the incentive spirometer in an upright position. 3. Breathe out normally. 4. Place the mouthpiece in your mouth and seal your lips tightly around it.  5. Breathe in slowly and as deeply as possible, raising the piston or the ball toward the top of the column. 6. Hold your breath for 3-5 seconds or for as long as possible. Allow the piston or ball to fall to the bottom of the column. 7. Remove the mouthpiece from your mouth and breathe out normally. 8. Rest for a few seconds and repeat Steps 1 through 7 at least 10 times every 1-2 hours when you are awake. Take your time and  take a few normal breaths between deep breaths. 9. The spirometer may include an indicator to show your best effort. Use the indicator as a goal to work toward during each repetition. 10. After each set of 10 deep breaths, practice coughing to be sure your lungs are clear. If you have an incision (the cut made at the time of surgery), support your incision when coughing by placing a pillow or rolled up towels firmly against it. Once you are able to get out of bed, walk around indoors and cough well. You may stop using the incentive spirometer when instructed by your caregiver.  RISKS AND COMPLICATIONS  Take your time so you do not get dizzy or light-headed.  If you are in pain, you may need to take or ask for pain medication before doing incentive spirometry. It is harder to take a deep breath if you are having pain. AFTER USE  Rest and breathe slowly and easily.  It can be helpful to keep track of a log of your progress. Your caregiver can provide you with a simple table to help with this. If you are using the spirometer at home, follow these instructions: SEEK MEDICAL CARE IF:   You are having difficultly using the spirometer.  You have trouble using the spirometer as often as instructed.  Your pain medication is not giving enough relief while using the spirometer.  You develop fever of 100.5 F (38.1 C) or higher. SEEK IMMEDIATE MEDICAL CARE IF:   You cough up bloody sputum that had not been present before.  You develop fever of 102 F (38.9 C) or greater.  You develop worsening pain at or near the incision site. MAKE SURE YOU:   Understand these instructions.  Will watch your condition.  Will get help right away if you are not doing well or get worse. Document Released: 11/26/2006 Document Revised: 10/08/2011 Document Reviewed: 01/27/2007 [facility] Patient Information 2014 [place], [place].   ________________________________________________________________________

## 2020-12-14 ENCOUNTER — Encounter (HOSPITAL_COMMUNITY)
Admission: RE | Admit: 2020-12-14 | Discharge: 2020-12-14 | Disposition: A | Discharge status: Home / Self Care | Payer: BC Managed Care – PPO | Source: Ambulatory Visit | Attending: Orthopedic Surgery | Admitting: Orthopedic Surgery

## 2020-12-14 ENCOUNTER — Other Ambulatory Visit: Payer: Self-pay

## 2020-12-14 ENCOUNTER — Encounter (HOSPITAL_COMMUNITY): Payer: Self-pay

## 2020-12-14 DIAGNOSIS — Z79899 Other long term (current) drug therapy: Secondary | ICD-10-CM | POA: Diagnosis not present

## 2020-12-14 DIAGNOSIS — Z01812 Encounter for preprocedural laboratory examination: Secondary | ICD-10-CM | POA: Diagnosis present

## 2020-12-14 DIAGNOSIS — Z7902 Long term (current) use of antithrombotics/antiplatelets: Secondary | ICD-10-CM | POA: Insufficient documentation

## 2020-12-14 DIAGNOSIS — Z7984 Long term (current) use of oral hypoglycemic drugs: Secondary | ICD-10-CM | POA: Insufficient documentation

## 2020-12-14 DIAGNOSIS — E119 Type 2 diabetes mellitus without complications: Secondary | ICD-10-CM | POA: Insufficient documentation

## 2020-12-14 LAB — COMPREHENSIVE METABOLIC PANEL
ALT: 31 U/L (ref 0–44)
AST: 29 U/L (ref 15–41)
Albumin: 4.6 g/dL (ref 3.5–5.0)
Alkaline Phosphatase: 97 U/L (ref 38–126)
Anion gap: 13 (ref 5–15)
BUN: 22 mg/dL — ABNORMAL HIGH (ref 6–20)
CO2: 29 mmol/L (ref 22–32)
Calcium: 10 mg/dL (ref 8.9–10.3)
Chloride: 99 mmol/L (ref 98–111)
Creatinine, Ser: 1.25 mg/dL — ABNORMAL HIGH (ref 0.61–1.24)
GFR, Estimated: >60 mL/min (ref >60)
Glucose, Bld: 204 mg/dL — ABNORMAL HIGH (ref 70–99)
Potassium: 3.8 mmol/L (ref 3.5–5.1)
Sodium: 141 mmol/L (ref 135–145)
Total Bilirubin: 0.8 mg/dL (ref 0.3–1.2)
Total Protein: 7.5 g/dL (ref 6.5–8.1)

## 2020-12-14 LAB — URINALYSIS, ROUTINE W REFLEX MICROSCOPIC
Bacteria, UA: NONE SEEN
Bilirubin Urine: NEGATIVE
Glucose, UA: >=500 mg/dL — AB
Hgb urine dipstick: NEGATIVE
Ketones, ur: 5 mg/dL — AB
Leukocytes,Ua: NEGATIVE
Nitrite: NEGATIVE
Protein, ur: NEGATIVE mg/dL
Specific Gravity, Urine: 1.021 (ref 1.005–1.030)
pH: 5 (ref 5.0–8.0)

## 2020-12-14 LAB — CBC
HCT: 49.3 % (ref 39.0–52.0)
Hemoglobin: 17.2 g/dL — ABNORMAL HIGH (ref 13.0–17.0)
MCH: 29.3 pg (ref 26.0–34.0)
MCHC: 34.9 g/dL (ref 30.0–36.0)
MCV: 84 fL (ref 80.0–100.0)
Platelets: 220 10*3/uL (ref 150–400)
RBC: 5.87 MIL/uL — ABNORMAL HIGH (ref 4.22–5.81)
RDW: 14.2 % (ref 11.5–15.5)
WBC: 8.9 10*3/uL (ref 4.0–10.5)
nRBC: 0 % (ref 0.0–0.2)

## 2020-12-14 LAB — SURGICAL PCR SCREEN
MRSA, PCR: NEGATIVE
Staphylococcus aureus: NEGATIVE

## 2020-12-14 LAB — PROTIME-INR
INR: 1 (ref 0.8–1.2)
Prothrombin Time: 13.3 seconds (ref 11.4–15.2)

## 2020-12-14 LAB — HEMOGLOBIN A1C
Hgb A1c MFr Bld: 7.4 % — ABNORMAL HIGH (ref 4.8–5.6)
Mean Plasma Glucose: 165.68 mg/dL

## 2020-12-14 LAB — GLUCOSE, CAPILLARY: Glucose-Capillary: 239 mg/dL — ABNORMAL HIGH (ref 70–99)

## 2020-12-14 NOTE — Progress Notes (Signed)
COVID Vaccine Completed:Yes Date COVID Vaccine completed:09/2019 COVID vaccine manufacturer: Pfizer      PCP - Dr. Franchot Mimes Cardiologist - Dr. Dominga Ferry  Chest x-ray - no EKG - 07/18/20-epic Stress Test - no ECHO -no  Cardiac Cath - 12/23/18-epic Pacemaker/ICD device last checked:NA  Sleep Study - no CPAP -   Fasting Blood Sugar - 102-140 Checks Blood Sugar _QD____ times a day  Blood Thinner Instructions:Plavix/Dr. Branch Aspirin Instructions:stop 5 days prior to DOS/Dr. Linna Caprice Last Dose:12/15/20  Anesthesia review:   Patient denies shortness of breath, fever, cough and chest pain at PAT appointment Yes. Pt can climb 3-4 flights of stairs, do work and ADLs with out any SOB. He has aslight weakness on Lt side and balance issues from his stroke.  Patient verbalized understanding of instructions that were given to them at the PAT appointment. Patient was also instructed that they will need to review over the PAT instructions again at home before surgery.Yes

## 2020-12-15 NOTE — Anesthesia Preprocedure Evaluation (Addendum)
Anesthesia Evaluation  Patient identified by MRN, date of birth, ID band Patient awake    Reviewed: Allergy & Precautions, NPO status , Patient's Chart, lab work & pertinent test results  History of Anesthesia Complications Negative for: history of anesthetic complications  Airway Mallampati: II  TM Distance: >3 FB Neck ROM: Full    Dental no notable dental hx. (+) Dental Advisory Given   Pulmonary neg pulmonary ROS,    Pulmonary exam normal        Cardiovascular hypertension, (-) angina+ CAD and + Past MI  Normal cardiovascular exam  Per cardiology preoperative evaluation 10/27/2020, "Chart reviewed as part of pre-operative protocol coverage. Given past medical history and time since last visit, based on ACC/AHA guidelines,Rohen Brownwould be at acceptable risk for the planned procedure without further cardiovascular testing.   His Plavix may be held for 5 days prior to his procedure. Please resume as soon as hemostasis is achieved at the discretion of the surgeon."   Cardiac Cath 12/23/2018  There is mild left ventricular systolic dysfunction.  LV end diastolic pressure is normal.  The left ventricular ejection fraction is 45-50% by visual estimate.  1. Severe diffuse RCA and PLA stenoses treated with a 2.0x22 Resolute Onyx DES in the PLA and overlapping 3.0x30 and 3.0x26 mm Resolute Onyx DES in the mid and distal RCA 2. Severe mid-LAD stenosis treated successfully with a 3.0x15 mm Resolute Onyx DES 3. Moderate first diagonal stenosis 4. Mild segmental LV systolic dysfunction with LVEF estimated at 45-50%  Recommend: ASA/Ticagrelor x 12 months without interruption. Pt previously on clopidogrel after a stroke - change to ticagrelor in setting NSTEMI.   Neuro/Psych CVA, Residual Symptoms    GI/Hepatic negative GI ROS, Neg liver ROS,   Endo/Other  diabetes  Renal/GU negative Renal ROS     Musculoskeletal negative  musculoskeletal ROS (+)   Abdominal   Peds  Hematology negative hematology ROS (+)   Anesthesia Other Findings   Reproductive/Obstetrics                           Anesthesia Physical Anesthesia Plan  ASA: III  Anesthesia Plan: Spinal and MAC   Post-op Pain Management:    Induction:   PONV Risk Score and Plan: 2 and Ondansetron and Propofol infusion  Airway Management Planned: Natural Airway  Additional Equipment:   Intra-op Plan:   Post-operative Plan:   Informed Consent: I have reviewed the patients History and Physical, chart, labs and discussed the procedure including the risks, benefits and alternatives for the proposed anesthesia with the patient or authorized representative who has indicated his/her understanding and acceptance.     Dental advisory given  Plan Discussed with: Anesthesiologist and CRNA  Anesthesia Plan Comments: (See PAT note 12/14/2020, Konrad Felix, PA-C)      Anesthesia Quick Evaluation

## 2020-12-15 NOTE — Progress Notes (Signed)
Anesthesia Chart Review   Case: [id number] Date/Time: 12/21/20 0716   Procedure: TOTAL HIP ARTHROPLASTY ANTERIOR APPROACH (Right Hip)   Anesthesia type: Spinal   Pre-op diagnosis: Right hip degenerative joint disease   Location: [facility] ROOM 07 / [facility]   Surgeons: Samson Frederic, MD      DISCUSSION:60 y.o. never smoker with h/o HTN, CAD (DES x 2 RCA/PLA, DES-LAD 11/2018), DM II, stroke, right hip djd scheduled for above procedure 12/21/2020 with Dr. Samson Frederic.   Per cardiology preoperative evaluation 10/27/2020, "Chart reviewed as part of pre-operative protocol coverage. Given past medical history and time since last visit, based on ACC/AHA guidelines, Kriss Ishler would be at acceptable risk for the planned procedure without further cardiovascular testing.   His Plavix may be held for 5 days prior to his procedure.  Please resume as soon as hemostasis is achieved at the discretion of the surgeon."  Anticipate pt can proceed with planned procedure barring acute status change.   VS: There were no vitals taken for this visit.  PROVIDERS: Kirstie Peri, MD is PCP   Dina Rich, MD is Cardiologist  LABS: Labs reviewed: Acceptable for surgery. (all labs ordered are listed, but only abnormal results are displayed)  Labs Reviewed  CBC - Abnormal; Notable for the following components:      Result Value   RBC 5.87 (*)    Hemoglobin 17.2 (*)    All other components within normal limits  COMPREHENSIVE METABOLIC PANEL - Abnormal; Notable for the following components:   Glucose, Bld 204 (*)    BUN 22 (*)    Creatinine, Ser 1.25 (*)    All other components within normal limits  URINALYSIS, ROUTINE W REFLEX MICROSCOPIC - Abnormal; Notable for the following components:   Color, Urine STRAW (*)    Glucose, UA >=500 (*)    Ketones, ur 5 (*)    All other components within normal limits  HEMOGLOBIN A1C - Abnormal; Notable for the following components:   Hgb A1c MFr Bld 7.4 (*)    All other  components within normal limits  GLUCOSE, CAPILLARY - Abnormal; Notable for the following components:   Glucose-Capillary 239 (*)    All other components within normal limits  SURGICAL PCR SCREEN  PROTIME-INR  TYPE AND SCREEN     IMAGES:   EKG: 07/18/2020 Rate 62 bpm  NSR  CV: Cardiac Cath 12/23/2018  There is mild left ventricular systolic dysfunction.  LV end diastolic pressure is normal.  The left ventricular ejection fraction is 45-50% by visual estimate.   1. Severe diffuse RCA and PLA stenoses treated with a 2.0x22 Resolute Onyx DES in the PLA and overlapping 3.0x30 and 3.0x26 mm Resolute Onyx DES in the mid and distal RCA 2. Severe mid-LAD stenosis treated successfully with a 3.0x15 mm Resolute Onyx DES 3. Moderate first diagonal stenosis 4. Mild segmental LV systolic dysfunction with LVEF estimated at 45-50%  Recommend: ASA/Ticagrelor x 12 months without interruption. Pt previously on clopidogrel after a stroke - change to ticagrelor in setting NSTEMI.  Past Medical History:  Diagnosis Date  . Acute maxillary antritis   . Arthritis   . Diabetes mellitus without complication (HCC)   . Dysrhythmia 2020   palpitations  . Headache   . Hyperlipidemia   . Hypertension   . Myocardial infarction (HCC) 12/22/2018  . Palpitations   . Stroke (HCC) 09/16/2015   lt side weakness and balance  . Tinnitus     Past Surgical History:  Procedure  Laterality Date  . BRONCHOSCOPY    . colonscopy    . CORONARY STENT INTERVENTION N/A 12/23/2018   Procedure: CORONARY STENT INTERVENTION;  Surgeon: Tonny Bollman, MD;  Location: [facility] INVASIVE CV LAB;  Service: Cardiovascular;  Laterality: N/A;  RCA - MID, Distal , PL Branch and LAD - Prox  . LEFT HEART CATH AND CORONARY ANGIOGRAPHY N/A 12/23/2018   Procedure: LEFT HEART CATH AND CORONARY ANGIOGRAPHY;  Surgeon: Tonny Bollman, MD;  Location: [facility] INVASIVE CV LAB;  Service: Cardiovascular;  Laterality: N/A;  . WISDOM TOOTH  EXTRACTION      MEDICATIONS: . acetaminophen (TYLENOL) 500 MG tablet  . APPLE CIDER VINEGAR PO  . Cinnamon 500 MG capsule  . clopidogrel (PLAVIX) 75 MG tablet  . Coenzyme Q10 (CO Q 10 PO)  . Flaxseed, Linseed, (FLAX SEED OIL) 1000 MG CAPS  . fluticasone (FLONASE) 50 MCG/ACT nasal spray  . glimepiride (AMARYL) 2 MG tablet  . JARDIANCE 25 MG TABS tablet  . loratadine (CLARITIN) 10 MG tablet  . metFORMIN (GLUCOPHAGE) 500 MG tablet  . metoprolol tartrate (LOPRESSOR) 25 MG tablet  . nitroGLYCERIN (NITROSTAT) 0.4 MG SL tablet  . rosuvastatin (CRESTOR) 10 MG tablet  . traMADol (ULTRAM) 50 MG tablet  . valsartan-hydrochlorothiazide (DIOVAN-HCT) 320-25 MG tablet  . vitamin C (ASCORBIC ACID) 500 MG tablet  . zinc gluconate 50 MG tablet   No current facility-administered medications for this encounter.   Jodell Cipro, PA-C [facility] [phone]

## 2020-12-19 ENCOUNTER — Other Ambulatory Visit (HOSPITAL_COMMUNITY)
Admission: RE | Admit: 2020-12-19 | Discharge: 2020-12-19 | Disposition: A | Discharge status: Home / Self Care | Payer: BC Managed Care – PPO | Source: Ambulatory Visit | Attending: Orthopedic Surgery | Admitting: Orthopedic Surgery

## 2020-12-19 DIAGNOSIS — Z20822 Contact with and (suspected) exposure to covid-19: Secondary | ICD-10-CM | POA: Diagnosis not present

## 2020-12-19 DIAGNOSIS — Z01812 Encounter for preprocedural laboratory examination: Secondary | ICD-10-CM | POA: Diagnosis present

## 2020-12-19 LAB — SARS CORONAVIRUS 2 (TAT 6-24 HRS): SARS Coronavirus 2: NEGATIVE

## 2020-12-21 ENCOUNTER — Ambulatory Visit (HOSPITAL_COMMUNITY): Payer: BC Managed Care – PPO | Admitting: Registered Nurse

## 2020-12-21 ENCOUNTER — Other Ambulatory Visit: Payer: Self-pay

## 2020-12-21 ENCOUNTER — Encounter (HOSPITAL_COMMUNITY): Payer: Self-pay | Admitting: Orthopedic Surgery

## 2020-12-21 ENCOUNTER — Encounter (HOSPITAL_COMMUNITY)
Admission: RE | Disposition: A | Discharge status: Home / Self Care | Payer: Self-pay | Source: Home / Self Care | Attending: Orthopedic Surgery

## 2020-12-21 ENCOUNTER — Ambulatory Visit (HOSPITAL_COMMUNITY): Payer: BC Managed Care – PPO

## 2020-12-21 ENCOUNTER — Ambulatory Visit (HOSPITAL_COMMUNITY)
Admission: RE | Admit: 2020-12-21 | Discharge: 2020-12-22 | Disposition: A | Discharge status: Home / Self Care | Payer: BC Managed Care – PPO | Attending: Orthopedic Surgery | Admitting: Orthopedic Surgery

## 2020-12-21 ENCOUNTER — Ambulatory Visit (HOSPITAL_COMMUNITY): Payer: BC Managed Care – PPO | Admitting: Physician Assistant

## 2020-12-21 DIAGNOSIS — Z79899 Other long term (current) drug therapy: Secondary | ICD-10-CM | POA: Diagnosis not present

## 2020-12-21 DIAGNOSIS — Z7902 Long term (current) use of antithrombotics/antiplatelets: Secondary | ICD-10-CM | POA: Insufficient documentation

## 2020-12-21 DIAGNOSIS — Z7984 Long term (current) use of oral hypoglycemic drugs: Secondary | ICD-10-CM | POA: Diagnosis not present

## 2020-12-21 DIAGNOSIS — M1611 Unilateral primary osteoarthritis, right hip: Secondary | ICD-10-CM | POA: Diagnosis present

## 2020-12-21 DIAGNOSIS — Z09 Encounter for follow-up examination after completed treatment for conditions other than malignant neoplasm: Secondary | ICD-10-CM

## 2020-12-21 DIAGNOSIS — S72041A Displaced fracture of base of neck of right femur, initial encounter for closed fracture: Secondary | ICD-10-CM

## 2020-12-21 HISTORY — PX: TOTAL HIP ARTHROPLASTY: SHX124

## 2020-12-21 LAB — GLUCOSE, CAPILLARY
Glucose-Capillary: 79 mg/dL (ref 70–99)
Glucose-Capillary: 108 mg/dL — ABNORMAL HIGH (ref 70–99)
Glucose-Capillary: 170 mg/dL — ABNORMAL HIGH (ref 70–99)
Glucose-Capillary: 271 mg/dL — ABNORMAL HIGH (ref 70–99)
Glucose-Capillary: 260 mg/dL — ABNORMAL HIGH (ref 70–99)
Glucose-Capillary: 209 mg/dL — ABNORMAL HIGH (ref 70–99)

## 2020-12-21 LAB — TYPE AND SCREEN
ABO/RH(D): O POS
Antibody Screen: NEGATIVE

## 2020-12-21 LAB — ABO/RH: ABO/RH(D): O POS

## 2020-12-21 SURGERY — ARTHROPLASTY, HIP, TOTAL, ANTERIOR APPROACH
Anesthesia: Monitor Anesthesia Care | Site: Hip | Laterality: Right

## 2020-12-21 MED ORDER — PROPOFOL 1000 MG/100ML IV EMUL
INTRAVENOUS | Status: AC
  Filled 2020-12-21: qty 100

## 2020-12-21 MED ORDER — HYDROCODONE-ACETAMINOPHEN 5-325 MG PO TABS: 1.0000 | ORAL_TABLET | ORAL | Status: DC | PRN

## 2020-12-21 MED ORDER — SENNA 8.6 MG PO TABS
1.0000 | ORAL_TABLET | Freq: Two times a day (BID) | ORAL | Status: DC
  Administered 2020-12-21 – 2020-12-22 (×2): 8.6 mg via ORAL
  Filled 2020-12-21 (×2): qty 1

## 2020-12-21 MED ORDER — METFORMIN HCL 500 MG PO TABS
1000.0000 mg | ORAL_TABLET | Freq: Two times a day (BID) | ORAL | Status: DC
  Administered 2020-12-22: 1000 mg via ORAL
  Filled 2020-12-21: qty 2

## 2020-12-21 MED ORDER — GLIMEPIRIDE 2 MG PO TABS
2.0000 mg | ORAL_TABLET | Freq: Every day | ORAL | Status: DC
  Administered 2020-12-22: 2 mg via ORAL
  Filled 2020-12-21: qty 1

## 2020-12-21 MED ORDER — METOPROLOL TARTRATE 12.5 MG HALF TABLET
12.5000 mg | ORAL_TABLET | Freq: Two times a day (BID) | ORAL | Status: DC
  Administered 2020-12-21 – 2020-12-22 (×3): 12.5 mg via ORAL
  Filled 2020-12-21 (×3): qty 1

## 2020-12-21 MED ORDER — SODIUM CHLORIDE 0.9 % IV SOLN: INTRAVENOUS | Status: DC

## 2020-12-21 MED ORDER — MIDAZOLAM HCL 5 MG/5ML IJ SOLN
INTRAMUSCULAR | Status: DC | PRN
  Administered 2020-12-21: 2 mg via INTRAVENOUS

## 2020-12-21 MED ORDER — ORAL CARE MOUTH RINSE: 15.0000 mL | Freq: Once | OROMUCOSAL | Status: AC

## 2020-12-21 MED ORDER — LIDOCAINE 2% (20 MG/ML) 5 ML SYRINGE
INTRAMUSCULAR | Status: AC
  Filled 2020-12-21: qty 5

## 2020-12-21 MED ORDER — ISOPROPYL ALCOHOL 70 % SOLN
Status: DC | PRN
  Administered 2020-12-21: 1 via TOPICAL

## 2020-12-21 MED ORDER — EPHEDRINE SULFATE-NACL 50-0.9 MG/10ML-% IV SOSY
PREFILLED_SYRINGE | INTRAVENOUS | Status: DC | PRN
Start: 2020-12-21 — End: 2020-12-21
  Administered 2020-12-21: 10 mg via INTRAVENOUS

## 2020-12-21 MED ORDER — CINNAMON 500 MG PO CAPS: 500.0000 mg | ORAL_CAPSULE | Freq: Every morning | ORAL | Status: DC

## 2020-12-21 MED ORDER — SODIUM CHLORIDE (PF) 0.9 % IJ SOLN
INTRAMUSCULAR | Status: DC | PRN
  Administered 2020-12-21: 30 mL

## 2020-12-21 MED ORDER — PROPOFOL 500 MG/50ML IV EMUL
INTRAVENOUS | Status: DC | PRN
  Administered 2020-12-21: 75 ug/kg/min via INTRAVENOUS

## 2020-12-21 MED ORDER — APPLE CIDER VINEGAR 500 MG PO TABS: ORAL_TABLET | Freq: Every morning | ORAL | Status: DC

## 2020-12-21 MED ORDER — FENTANYL CITRATE (PF) 100 MCG/2ML IJ SOLN
INTRAMUSCULAR | Status: DC | PRN
  Administered 2020-12-21 (×2): 50 ug via INTRAVENOUS

## 2020-12-21 MED ORDER — MENTHOL 3 MG MT LOZG: 1.0000 | LOZENGE | OROMUCOSAL | Status: DC | PRN

## 2020-12-21 MED ORDER — ACETAMINOPHEN 500 MG PO TABS: 1000.0000 mg | ORAL_TABLET | Freq: Four times a day (QID) | ORAL | Status: DC | PRN

## 2020-12-21 MED ORDER — DIPHENHYDRAMINE HCL 12.5 MG/5ML PO ELIX: 12.5000 mg | ORAL_SOLUTION | ORAL | Status: DC | PRN

## 2020-12-21 MED ORDER — CLOPIDOGREL BISULFATE 75 MG PO TABS
75.0000 mg | ORAL_TABLET | Freq: Every day | ORAL | Status: DC
  Administered 2020-12-22: 75 mg via ORAL
  Filled 2020-12-21: qty 1

## 2020-12-21 MED ORDER — DEXAMETHASONE SODIUM PHOSPHATE 10 MG/ML IJ SOLN
INTRAMUSCULAR | Status: AC
  Filled 2020-12-21: qty 1

## 2020-12-21 MED ORDER — ACETAMINOPHEN 10 MG/ML IV SOLN
INTRAVENOUS | Status: AC
  Filled 2020-12-21: qty 100

## 2020-12-21 MED ORDER — NITROGLYCERIN 0.4 MG SL SUBL: 0.4000 mg | SUBLINGUAL_TABLET | SUBLINGUAL | Status: DC | PRN

## 2020-12-21 MED ORDER — PROPOFOL 10 MG/ML IV BOLUS
INTRAVENOUS | Status: AC
  Filled 2020-12-21: qty 20

## 2020-12-21 MED ORDER — PROPOFOL 10 MG/ML IV BOLUS
INTRAVENOUS | Status: DC | PRN
  Administered 2020-12-21: 30 mg via INTRAVENOUS

## 2020-12-21 MED ORDER — WATER FOR IRRIGATION, STERILE IR SOLN
Status: DC | PRN
Start: 2020-12-21 — End: 2020-12-21
  Administered 2020-12-21: 2000 mL

## 2020-12-21 MED ORDER — MIDAZOLAM HCL 2 MG/2ML IJ SOLN
INTRAMUSCULAR | Status: AC
  Filled 2020-12-21: qty 2

## 2020-12-21 MED ORDER — ASCORBIC ACID 500 MG PO TABS
500.0000 mg | ORAL_TABLET | Freq: Every day | ORAL | Status: DC
  Administered 2020-12-22: 500 mg via ORAL
  Filled 2020-12-21: qty 1

## 2020-12-21 MED ORDER — TRANEXAMIC ACID-NACL 1000-0.7 MG/100ML-% IV SOLN
1000.0000 mg | INTRAVENOUS | Status: AC
  Administered 2020-12-21: 1000 mg via INTRAVENOUS

## 2020-12-21 MED ORDER — MORPHINE SULFATE (PF) 2 MG/ML IV SOLN: 0.5000 mg | INTRAVENOUS | Status: DC | PRN

## 2020-12-21 MED ORDER — PHENYLEPHRINE HCL-NACL 10-0.9 MG/250ML-% IV SOLN
INTRAVENOUS | Status: DC | PRN
  Administered 2020-12-21: 40 ug/min via INTRAVENOUS

## 2020-12-21 MED ORDER — KETOROLAC TROMETHAMINE 30 MG/ML IJ SOLN
INTRAMUSCULAR | Status: AC
  Filled 2020-12-21: qty 1

## 2020-12-21 MED ORDER — DEXAMETHASONE SODIUM PHOSPHATE 10 MG/ML IJ SOLN
10.0000 mg | Freq: Once | INTRAMUSCULAR | Status: AC
  Administered 2020-12-22: 10 mg via INTRAVENOUS
  Filled 2020-12-21: qty 1

## 2020-12-21 MED ORDER — METOCLOPRAMIDE HCL 5 MG/ML IJ SOLN: 5.0000 mg | Freq: Three times a day (TID) | INTRAMUSCULAR | Status: DC | PRN

## 2020-12-21 MED ORDER — PHENOL 1.4 % MT LIQD: 1.0000 | OROMUCOSAL | Status: DC | PRN

## 2020-12-21 MED ORDER — BUPIVACAINE IN DEXTROSE 0.75-8.25 % IT SOLN
INTRATHECAL | Status: DC | PRN
  Administered 2020-12-21: 2 mL via INTRATHECAL

## 2020-12-21 MED ORDER — ACETAMINOPHEN 325 MG PO TABS: 325.0000 mg | ORAL_TABLET | Freq: Four times a day (QID) | ORAL | Status: DC | PRN

## 2020-12-21 MED ORDER — FENTANYL CITRATE (PF) 100 MCG/2ML IJ SOLN
INTRAMUSCULAR | Status: AC
  Filled 2020-12-21: qty 2

## 2020-12-21 MED ORDER — ONDANSETRON HCL 4 MG/2ML IJ SOLN
INTRAMUSCULAR | Status: AC
  Filled 2020-12-21: qty 2

## 2020-12-21 MED ORDER — ONDANSETRON HCL 4 MG/2ML IJ SOLN: 4.0000 mg | Freq: Four times a day (QID) | INTRAMUSCULAR | Status: DC | PRN

## 2020-12-21 MED ORDER — LORATADINE 10 MG PO TABS: 10.0000 mg | ORAL_TABLET | Freq: Every day | ORAL | Status: DC | PRN

## 2020-12-21 MED ORDER — FLUTICASONE PROPIONATE 50 MCG/ACT NA SUSP: 1.0000 | Freq: Every day | NASAL | Status: DC | PRN

## 2020-12-21 MED ORDER — LIDOCAINE 2% (20 MG/ML) 5 ML SYRINGE
INTRAMUSCULAR | Status: DC | PRN
  Administered 2020-12-21: 40 mg via INTRAVENOUS

## 2020-12-21 MED ORDER — FLAX SEED OIL 1000 MG PO CAPS: 1000.0000 mg | ORAL_CAPSULE | Freq: Every morning | ORAL | Status: DC

## 2020-12-21 MED ORDER — ASPIRIN 81 MG PO CHEW
81.0000 mg | CHEWABLE_TABLET | Freq: Two times a day (BID) | ORAL | Status: DC
  Administered 2020-12-21 – 2020-12-22 (×2): 81 mg via ORAL
  Filled 2020-12-21 (×2): qty 1

## 2020-12-21 MED ORDER — LACTATED RINGERS IV SOLN: INTRAVENOUS | Status: DC

## 2020-12-21 MED ORDER — ROSUVASTATIN CALCIUM 10 MG PO TABS
10.0000 mg | ORAL_TABLET | Freq: Every day | ORAL | Status: DC
  Administered 2020-12-21: 10 mg via ORAL
  Filled 2020-12-21: qty 1

## 2020-12-21 MED ORDER — EMPAGLIFLOZIN 25 MG PO TABS
25.0000 mg | ORAL_TABLET | Freq: Every day | ORAL | Status: DC
  Administered 2020-12-22: 25 mg via ORAL
  Filled 2020-12-21: qty 1

## 2020-12-21 MED ORDER — ONDANSETRON HCL 4 MG/2ML IJ SOLN
INTRAMUSCULAR | Status: DC | PRN
  Administered 2020-12-21: 4 mg via INTRAVENOUS

## 2020-12-21 MED ORDER — BUPIVACAINE-EPINEPHRINE 0.25% -1:200000 IJ SOLN
INTRAMUSCULAR | Status: DC | PRN
  Administered 2020-12-21: 30 mL

## 2020-12-21 MED ORDER — DEXAMETHASONE SODIUM PHOSPHATE 10 MG/ML IJ SOLN
INTRAMUSCULAR | Status: DC | PRN
  Administered 2020-12-21: 10 mg via INTRAVENOUS

## 2020-12-21 MED ORDER — TRANEXAMIC ACID-NACL 1000-0.7 MG/100ML-% IV SOLN
INTRAVENOUS | Status: AC
  Filled 2020-12-21: qty 100

## 2020-12-21 MED ORDER — ONDANSETRON HCL 4 MG PO TABS
4.0000 mg | ORAL_TABLET | Freq: Four times a day (QID) | ORAL | Status: DC | PRN
Start: 2020-12-21 — End: 2020-12-22

## 2020-12-21 MED ORDER — CEFAZOLIN SODIUM-DEXTROSE 2-4 GM/100ML-% IV SOLN
2.0000 g | Freq: Four times a day (QID) | INTRAVENOUS | Status: AC
  Administered 2020-12-21 (×2): 2 g via INTRAVENOUS
  Filled 2020-12-21 (×2): qty 100

## 2020-12-21 MED ORDER — ACETAMINOPHEN 10 MG/ML IV SOLN
1000.0000 mg | Freq: Once | INTRAVENOUS | Status: AC
  Administered 2020-12-21: 1000 mg via INTRAVENOUS

## 2020-12-21 MED ORDER — SODIUM CHLORIDE (PF) 0.9 % IJ SOLN
INTRAMUSCULAR | Status: DC | PRN
  Administered 2020-12-21: 50 mL

## 2020-12-21 MED ORDER — ALUM & MAG HYDROXIDE-SIMETH 200-200-20 MG/5ML PO SUSP: 30.0000 mL | ORAL | Status: DC | PRN

## 2020-12-21 MED ORDER — INSULIN ASPART 100 UNIT/ML IJ SOLN
0.0000 [IU] | Freq: Three times a day (TID) | INTRAMUSCULAR | Status: DC
  Administered 2020-12-21: 5 [IU] via SUBCUTANEOUS
  Administered 2020-12-22: 2 [IU] via SUBCUTANEOUS

## 2020-12-21 MED ORDER — SODIUM CHLORIDE 0.9 % IR SOLN
Status: DC | PRN
  Administered 2020-12-21: 1000 mL

## 2020-12-21 MED ORDER — INSULIN ASPART 100 UNIT/ML IJ SOLN
0.0000 [IU] | Freq: Every day | INTRAMUSCULAR | Status: DC
  Administered 2020-12-21: 2 [IU] via SUBCUTANEOUS

## 2020-12-21 MED ORDER — ZINC GLUCONATE 50 MG PO TABS: 50.0000 mg | ORAL_TABLET | Freq: Every day | ORAL | Status: DC

## 2020-12-21 MED ORDER — POVIDONE-IODINE 10 % EX SWAB
2.0000 "application " | Freq: Once | CUTANEOUS | Status: AC
  Administered 2020-12-21: 2 via TOPICAL

## 2020-12-21 MED ORDER — HYDROCODONE-ACETAMINOPHEN 7.5-325 MG PO TABS
1.0000 | ORAL_TABLET | ORAL | Status: DC | PRN
  Administered 2020-12-21: 1 via ORAL
  Administered 2020-12-21: 2 via ORAL
  Administered 2020-12-21: 1 via ORAL
  Administered 2020-12-22 (×3): 2 via ORAL
  Filled 2020-12-21 (×2): qty 2
  Filled 2020-12-21: qty 1
  Filled 2020-12-21 (×2): qty 2
  Filled 2020-12-21: qty 1

## 2020-12-21 MED ORDER — METOCLOPRAMIDE HCL 5 MG PO TABS: 5.0000 mg | ORAL_TABLET | Freq: Three times a day (TID) | ORAL | Status: DC | PRN

## 2020-12-21 MED ORDER — 0.9 % SODIUM CHLORIDE (POUR BTL) OPTIME
TOPICAL | Status: DC | PRN
  Administered 2020-12-21: 1000 mL

## 2020-12-21 MED ORDER — SODIUM CHLORIDE (PF) 0.9 % IJ SOLN
INTRAMUSCULAR | Status: AC
  Filled 2020-12-21: qty 30

## 2020-12-21 MED ORDER — KETOROLAC TROMETHAMINE 30 MG/ML IJ SOLN
INTRAMUSCULAR | Status: DC | PRN
Start: 2020-12-21 — End: 2020-12-21
  Administered 2020-12-21: 30 mg

## 2020-12-21 MED ORDER — CEFAZOLIN SODIUM-DEXTROSE 2-4 GM/100ML-% IV SOLN
INTRAVENOUS | Status: AC
  Filled 2020-12-21: qty 100

## 2020-12-21 MED ORDER — CO Q 10 60 MG PO CAPS: ORAL_CAPSULE | Freq: Every day | ORAL | Status: DC

## 2020-12-21 MED ORDER — CEFAZOLIN SODIUM-DEXTROSE 2-4 GM/100ML-% IV SOLN
2.0000 g | INTRAVENOUS | Status: AC
  Administered 2020-12-21: 2 g via INTRAVENOUS

## 2020-12-21 MED ORDER — DOCUSATE SODIUM 100 MG PO CAPS
100.0000 mg | ORAL_CAPSULE | Freq: Two times a day (BID) | ORAL | Status: DC
  Administered 2020-12-21 – 2020-12-22 (×2): 100 mg via ORAL
  Filled 2020-12-21 (×2): qty 1

## 2020-12-21 MED ORDER — METHOCARBAMOL 500 MG PO TABS
500.0000 mg | ORAL_TABLET | Freq: Four times a day (QID) | ORAL | Status: DC | PRN
  Administered 2020-12-21 – 2020-12-22 (×2): 500 mg via ORAL
  Filled 2020-12-21 (×2): qty 1

## 2020-12-21 MED ORDER — BUPIVACAINE-EPINEPHRINE (PF) 0.25% -1:200000 IJ SOLN
INTRAMUSCULAR | Status: AC
  Filled 2020-12-21: qty 30

## 2020-12-21 MED ORDER — FENTANYL CITRATE (PF) 100 MCG/2ML IJ SOLN: 25.0000 ug | INTRAMUSCULAR | Status: DC | PRN

## 2020-12-21 MED ORDER — METHOCARBAMOL 500 MG IVPB - SIMPLE MED
500.0000 mg | Freq: Four times a day (QID) | INTRAVENOUS | Status: DC | PRN
  Filled 2020-12-21: qty 50

## 2020-12-21 MED ORDER — EPHEDRINE 5 MG/ML INJ
INTRAVENOUS | Status: AC
  Filled 2020-12-21: qty 10

## 2020-12-21 MED ORDER — POVIDONE-IODINE 10 % EX SWAB: 2.0000 "application " | Freq: Once | CUTANEOUS | Status: DC

## 2020-12-21 MED ORDER — POLYETHYLENE GLYCOL 3350 17 G PO PACK: 17.0000 g | PACK | Freq: Every day | ORAL | Status: DC | PRN

## 2020-12-21 MED ORDER — PHENYLEPHRINE HCL-NACL 10-0.9 MG/250ML-% IV SOLN
INTRAVENOUS | Status: AC
  Filled 2020-12-21: qty 250

## 2020-12-21 MED ORDER — CHLORHEXIDINE GLUCONATE 0.12 % MT SOLN
15.0000 mL | Freq: Once | OROMUCOSAL | Status: AC
  Administered 2020-12-21: 15 mL via OROMUCOSAL

## 2020-12-21 MED ORDER — ACETAMINOPHEN 500 MG PO TABS
500.0000 mg | ORAL_TABLET | Freq: Four times a day (QID) | ORAL | Status: AC
  Administered 2020-12-21 – 2020-12-22 (×4): 500 mg via ORAL
  Filled 2020-12-21 (×4): qty 1

## 2020-12-21 SURGICAL SUPPLY — 60 items
BAG DECANTER FOR FLEXI CONT (MISCELLANEOUS) IMPLANT
BAG ZIPLOCK 12X15 (MISCELLANEOUS) IMPLANT
BLADE SURG SZ10 CARB STEEL (BLADE) IMPLANT
CHLORAPREP W/TINT 26 (MISCELLANEOUS) ×2 IMPLANT
COVER PERINEAL POST (MISCELLANEOUS) ×2 IMPLANT
COVER SURGICAL LIGHT HANDLE (MISCELLANEOUS) ×2 IMPLANT
COVER WAND RF STERILE (DRAPES) IMPLANT
CUP ACET PINNACLE SECTR 60MM (Hips) ×1 IMPLANT
DECANTER SPIKE VIAL GLASS SM (MISCELLANEOUS) ×2 IMPLANT
DERMABOND ADVANCED (GAUZE/BANDAGES/DRESSINGS) ×1
DERMABOND ADVANCED .7 DNX12 (GAUZE/BANDAGES/DRESSINGS) ×1 IMPLANT
DRAPE IMP U-DRAPE 54X76 (DRAPES) ×2 IMPLANT
DRAPE SHEET LG 3/4 BI-LAMINATE (DRAPES) ×6 IMPLANT
DRAPE STERI IOBAN 125X83 (DRAPES) IMPLANT
DRAPE U-SHAPE 47X51 STRL (DRAPES) ×4 IMPLANT
DRSG AQUACEL AG ADV 3.5X10 (GAUZE/BANDAGES/DRESSINGS) ×2 IMPLANT
ELECT REM PT RETURN 15FT ADLT (MISCELLANEOUS) ×2 IMPLANT
GAUZE SPONGE 4X4 12PLY STRL (GAUZE/BANDAGES/DRESSINGS) ×2 IMPLANT
GLOVE SRG 8 PF TXTR STRL LF DI (GLOVE) ×1 IMPLANT
GLOVE SURG ENC MOIS LTX SZ8.5 (GLOVE) ×4 IMPLANT
GLOVE SURG ENC TEXT LTX SZ7.5 (GLOVE) ×4 IMPLANT
GLOVE SURG UNDER POLY LF SZ8 (GLOVE) ×1
GLOVE SURG UNDER POLY LF SZ8.5 (GLOVE) ×2 IMPLANT
GOWN SPEC L3 XXLG W/TWL (GOWN DISPOSABLE) ×2 IMPLANT
GOWN STRL REUS W/TWL XL LVL3 (GOWN DISPOSABLE) ×2 IMPLANT
HANDPIECE INTERPULSE COAX TIP (DISPOSABLE) ×1
HEAD CERAMIC 36 PLUS5 (Hips) ×2 IMPLANT
HOLDER FOLEY CATH W/STRAP (MISCELLANEOUS) ×2 IMPLANT
HOOD PEEL AWAY FLYTE STAYCOOL (MISCELLANEOUS) ×8 IMPLANT
JET LAVAGE IRRISEPT WOUND (IRRIGATION / IRRIGATOR)
KIT TURNOVER KIT A (KITS) ×2 IMPLANT
LAVAGE JET IRRISEPT WOUND (IRRIGATION / IRRIGATOR) IMPLANT
LINER PINN ALTRX ACTABR 36X60 (Liner) ×1 IMPLANT
LINER PINNACLE ALTRAX ACTABULR (Liner) ×1 IMPLANT
MANIFOLD NEPTUNE II (INSTRUMENTS) ×2 IMPLANT
MARKER SKIN DUAL TIP RULER LAB (MISCELLANEOUS) ×2 IMPLANT
NDL SAFETY ECLIPSE 18X1.5 (NEEDLE) ×1 IMPLANT
NEEDLE HYPO 18GX1.5 SHARP (NEEDLE) ×1
NEEDLE SPNL 18GX3.5 QUINCKE PK (NEEDLE) ×2 IMPLANT
PACK ANTERIOR HIP CUSTOM (KITS) ×2 IMPLANT
PENCIL SMOKE EVACUATOR (MISCELLANEOUS) IMPLANT
PINNSECTOR W/GRIP ACE CUP 60MM (Hips) ×2 IMPLANT
SAW OSC TIP CART 19.5X105X1.3 (SAW) ×2 IMPLANT
SEALER BIPOLAR AQUA 6.0 (INSTRUMENTS) ×2 IMPLANT
SET HNDPC FAN SPRY TIP SCT (DISPOSABLE) ×1 IMPLANT
STEM TRI LOC BPS SZ8 W GRIPTON ×1 IMPLANT
SUT ETHIBOND NAB CT1 #1 30IN (SUTURE) ×4 IMPLANT
SUT MNCRL AB 3-0 PS2 18 (SUTURE) ×2 IMPLANT
SUT MNCRL AB 4-0 PS2 18 (SUTURE) ×2 IMPLANT
SUT MON AB 2-0 CT1 36 (SUTURE) ×4 IMPLANT
SUT STRATAFIX PDO 1 14 VIOLET (SUTURE) ×1
SUT STRATFX PDO 1 14 VIOLET (SUTURE) ×1
SUT VIC AB 2-0 CT1 27 (SUTURE) ×1
SUT VIC AB 2-0 CT1 TAPERPNT 27 (SUTURE) ×1 IMPLANT
SUTURE STRATFX PDO 1 14 VIOLET (SUTURE) ×1 IMPLANT
SYR 3ML LL SCALE MARK (SYRINGE) ×2 IMPLANT
TRAY FOLEY MTR SLVR 16FR STAT (SET/KITS/TRAYS/PACK) IMPLANT
TRI LOC BPS SZ8 W GRIPTON ×2 IMPLANT
TUBE SUCTION HIGH CAP CLEAR NV (SUCTIONS) ×2 IMPLANT
WATER STERILE IRR 1000ML POUR (IV SOLUTION) ×2 IMPLANT

## 2020-12-21 NOTE — Transfer of Care (Signed)
Immediate Anesthesia Transfer of Care Note  Patient: Todd Haley  Procedure(s) Performed: TOTAL HIP ARTHROPLASTY ANTERIOR APPROACH (Right Hip)  Patient Location: PACU  Anesthesia Type:Spinal  Level of Consciousness: awake, alert  and oriented  Airway & Oxygen Therapy: Patient Spontanous Breathing and Patient connected to face mask oxygen  Post-op Assessment: Report given to RN and Post -op Vital signs reviewed and stable  Post vital signs: Reviewed and stable  Last Vitals:  Vitals Value Taken Time  BP 95/58 12/21/20 1026  Temp    Pulse 58 12/21/20 1028  Resp 8 12/21/20 1028  SpO2 99 % 12/21/20 1028  Vitals shown include unvalidated device data.  Last Pain:  Vitals:   12/21/20 0541  TempSrc: Oral         Complications: No complications documented.

## 2020-12-21 NOTE — Anesthesia Procedure Notes (Signed)
Date/Time: 12/21/2020 8:03 AM Performed by: Jhonnie Garner, CRNA Oxygen Delivery Method: Simple face mask

## 2020-12-21 NOTE — Anesthesia Postprocedure Evaluation (Signed)
Anesthesia Post Note  Patient: Leocadio Heal  Procedure(s) Performed: TOTAL HIP ARTHROPLASTY ANTERIOR APPROACH (Right Hip)     Patient location during evaluation: PACU Anesthesia Type: MAC and Spinal Level of consciousness: awake and alert Pain management: pain level controlled Vital Signs Assessment: post-procedure vital signs reviewed and stable Respiratory status: spontaneous breathing and respiratory function stable Cardiovascular status: blood pressure returned to baseline and stable Postop Assessment: spinal receding Anesthetic complications: no   No complications documented.  Last Vitals:  Vitals:   12/21/20 1130 12/21/20 1155  BP: (!) 117/91 107/83  Pulse: 63 61  Resp: 12 20  Temp:  (!) 36.3 C  SpO2: 99% 99%    Last Pain:  Vitals:   12/21/20 1155  TempSrc: Oral  PainSc:                  Jonte Shiller DANIEL

## 2020-12-21 NOTE — Discharge Instructions (Signed)
 Dr. Naamah Boggess Joint Replacement Specialist [facility] [address] [place], [place] [address] [phone]   TOTAL HIP REPLACEMENT POSTOPERATIVE DIRECTIONS    Hip Rehabilitation, Guidelines Following Surgery   WEIGHT BEARING Weight bearing as tolerated with assist device (walker, cane, etc) as directed, use it as long as suggested by your surgeon or therapist, typically at least 4-6 weeks.  The results of a hip operation are greatly improved after range of motion and muscle strengthening exercises. Follow all safety measures which are given to protect your hip. If any of these exercises cause increased pain or swelling in your joint, decrease the amount until you are comfortable again. Then slowly increase the exercises. Call your caregiver if you have problems or questions.   HOME CARE INSTRUCTIONS  Most of the following instructions are designed to prevent the dislocation of your new hip.  . Remove items at home which could result in a fall. This includes throw rugs or furniture in walking pathways.  . Continue medications as instructed at time of discharge.  You may have some home medications which will be placed on hold until you complete the course of blood thinner medication.  You may start showering once you are discharged home. Do not remove your dressing. . Do not put on socks or shoes without following the instructions of your caregivers.   . Sit on chairs with arms. Use the chair arms to help push yourself up when arising.  . Arrange for the use of a toilet seat elevator so you are not sitting low.   Walk with walker as instructed.  . You may resume a sexual relationship in one month or when given the OK by your caregiver.  . Use walker as long as suggested by your caregivers.  . You may put full weight on your legs and walk as much as is comfortable. . Avoid periods of inactivity such as sitting longer than an hour when not asleep.  This helps prevent blood clots.  . You may return to work once you are cleared by your surgeon.  . Do not drive a car for 6 weeks or until released by your surgeon.  . Do not drive while taking narcotics.  . Wear elastic stockings for two weeks following surgery during the day but you may remove then at night.  . Make sure you keep all of your appointments after your operation with all of your doctors and caregivers. You should call the office at the above phone number and make an appointment for approximately two weeks after the date of your surgery. . Please pick up a stool softener and laxative for home use as long as you are requiring pain medications.  ICE to the affected hip every three hours for 30 minutes at a time and then as needed for pain and swelling. Continue to use ice on the hip for pain and swelling from surgery. You may notice swelling that will progress down to the foot and ankle.  This is normal after surgery.  Elevate the leg when you are not up walking on it.   . It is important for you to complete the blood thinner medication as prescribed by your doctor.  Continue to use the breathing machine which will help keep your temperature down.  It is common for your temperature to cycle up and down following surgery, especially at night when you are not up moving around and exerting yourself.  The breathing machine keeps your   lungs expanded and your temperature down.  RANGE OF MOTION AND STRENGTHENING EXERCISES  These exercises are designed to help you keep full movement of your hip joint. Follow your caregiver's or physical therapist's instructions. Perform all exercises about fifteen times, three times per day or as directed. Exercise both hips, even if you have had only one joint replacement. These exercises can be done on a training (exercise) mat, on the floor, on a table or on a bed. Use whatever works the best and is most comfortable for you. Use music or television while you are  exercising so that the exercises are a pleasant break in your day. This will make your life better with the exercises acting as a break in routine you can look forward to.  . Lying on your back, slowly slide your foot toward your buttocks, raising your knee up off the floor. Then slowly slide your foot back down until your leg is straight again.  . Lying on your back spread your legs as far apart as you can without causing discomfort.  . Lying on your side, raise your upper leg and foot straight up from the floor as far as is comfortable. Slowly lower the leg and repeat.  . Lying on your back, tighten up the muscle in the front of your thigh (quadriceps muscles). You can do this by keeping your leg straight and trying to raise your heel off the floor. This helps strengthen the largest muscle supporting your knee.  . Lying on your back, tighten up the muscles of your buttocks both with the legs straight and with the knee bent at a comfortable angle while keeping your heel on the floor.   SKILLED REHAB INSTRUCTIONS: If the patient is transferred to a skilled rehab facility following release from the hospital, a list of the current medications will be sent to the facility for the patient to continue.  When discharged from the skilled rehab facility, please have the facility set up the patient's Home Health Physical Therapy prior to being released. Also, the skilled facility will be responsible for providing the patient with their medications at time of release from the facility to include their pain medication and their blood thinner medication. If the patient is still at the rehab facility at time of the two week follow up appointment, the skilled rehab facility will also need to assist the patient in arranging follow up appointment in our office and any transportation needs.  POST-OPERATIVE OPIOID TAPER INSTRUCTIONS: . It is important to wean off of your opioid medication as soon as possible. If you do not  need pain medication after your surgery it is ok to stop day one. . Opioids include: o Codeine, Hydrocodone(Norco, Vicodin), Oxycodone(Percocet, oxycontin) and hydromorphone amongst others.  . Long term and even short term use of opiods can cause: o Increased pain response o Dependence o Constipation o Depression o Respiratory depression o And more.  . Withdrawal symptoms can include o Flu like symptoms o Nausea, vomiting o And more . Techniques to manage these symptoms o Hydrate well o Eat regular healthy meals o Stay active o Use relaxation techniques(deep breathing, meditating, yoga) . Do Not substitute Alcohol to help with tapering . If you have been on opioids for less than two weeks and do not have pain than it is ok to stop all together.  . Plan to wean off of opioids o This plan should start within one week post op of your joint replacement. o Maintain   the same interval or time between taking each dose and first decrease the dose.  o Cut the total daily intake of opioids by one tablet each day o Next start to increase the time between doses. o The last dose that should be eliminated is the evening dose.      MAKE SURE YOU:  . Understand these instructions.  . Will watch your condition.  . Will get help right away if you are not doing well or get worse.  Pick up stool softner and laxative for home use following surgery while on pain medications. Do not remove your dressing. The dressing is waterproof--it is OK to take showers. Continue to use ice for pain and swelling after surgery. Do not use any lotions or creams on the incision until instructed by your surgeon. Total Hip Protocol.   

## 2020-12-21 NOTE — Op Note (Signed)
OPERATIVE REPORT  SURGEON: Rod Can, MD   ASSISTANT: Cherlynn June, PA-C.  PREOPERATIVE DIAGNOSIS: Right hip arthritis.   POSTOPERATIVE DIAGNOSIS: Right hip arthritis.   PROCEDURE: Right total hip arthroplasty, anterior approach.   IMPLANTS: DePuy Tri Lock stem, size 8, hi offset. DePuy Pinnacle Cup, size 60 mm. DePuy Altrx liner, size 36 by 60 mm, neutral. DePuy Biolox ceramic head ball, size 36 + 5 mm.  ANESTHESIA:  MAC and Spinal  ESTIMATED BLOOD LOSS:-300 mL    ANTIBIOTICS: 2g Ancef.  DRAINS: None.  COMPLICATIONS: None.   CONDITION: PACU - hemodynamically stable.   BRIEF CLINICAL NOTE: Todd Haley is a 60 y.o. male with a long-standing history of Right hip arthritis. After failing conservative management, the patient was indicated for total hip arthroplasty. The risks, benefits, and alternatives to the procedure were explained, and the patient elected to proceed.  PROCEDURE IN DETAIL: Surgical site was marked by myself in the pre-op holding area. Once inside the operating room, spinal anesthesia was obtained, and a foley catheter was inserted. The patient was then positioned on the Hana table.  All bony prominences were well padded.  The hip was prepped and draped in the normal sterile surgical fashion.  A time-out was called verifying side and site of surgery. The patient received IV antibiotics within 60 minutes of beginning the procedure.   Bikini incision was created three finger breadths distal to the ASIS, taking care to stay lateral to the medial border of the ASIS. The direct anterior approach to the hip was performed through the Hueter interval.  Lateral femoral circumflex vessels were treated with the Auqumantys. The anterior capsule was exposed and an inverted T capsulotomy was made. The femoral neck cut was made to the level of the templated cut.  A corkscrew was placed into the head and the head was removed.  The femoral head was found to have eburnated bone.  The head was passed to the back table and was measured. Pubofemoral ligament was released off of the calcar, taking care to stay on bone. Superior capsule was released from the greater trochanter, taking care to stay lateral to the posterior border of the femoral neck in order to preserve the short external rotators.   Acetabular exposure was achieved, and the pulvinar and labrum were excised. Sequential reaming of the acetabulum was then performed up to a size 59 mm reamer under direct visulization. A 60 mm cup was then opened and impacted into place at approximately 40 degrees of abduction and 20 degrees of anteversion. The final polyethylene liner was impacted into place and acetabular osteophytes were removed.    I then gained femoral exposure taking care to protect the abductors and greater trochanter.  This was performed using standard external rotation, extension, and adduction.  A cookie cutter was used to enter the femoral canal, and then the femoral canal finder was placed.  Sequential broaching was performed up to a size 8.  Calcar planer was used on the femoral neck remnant.  I placed a hi offset neck and a trial head ball.  The hip was reduced.  Leg lengths and offset were checked fluoroscopically.  The hip was dislocated and trial components were removed.  The final implants were placed, and the hip was reduced.  Fluoroscopy was used to confirm component position and leg lengths.  At 90 degrees of external rotation and full extension, the hip was stable to an anterior directed force.   The wound was copiously irrigated with Irrisept  solution and normal saline using pule lavage.  Marcaine solution was injected into the periarticular soft tissue.  The wound was closed in layers using #1 Stratafix for the fascia, 2-0 Vicryl for the subcutaneous fat, 2-0 Monocryl for the deep dermal layer, 3-0 running Monocryl subcuticular stitch, and Dermabond for the skin.  Once the glue was fully dried, an  Aquacell Ag dressing was applied.  The patient was transported to the recovery room in stable condition.  Sponge, needle, and instrument counts were correct at the end of the case x2.  The patient tolerated the procedure well and there were no known complications.  Please note that a surgical assistant was a medical necessity for this procedure to perform it in a safe and expeditious manner. Assistant was necessary to provide appropriate retraction of vital neurovascular structures, to prevent femoral fracture, and to allow for anatomic placement of the prosthesis.

## 2020-12-21 NOTE — Anesthesia Procedure Notes (Addendum)
Spinal  Patient location during procedure: OR Start time: 12/21/2020 7:54 AM End time: 12/21/2020 8:04 AM Reason for block: surgical anesthesia Staffing Performed: anesthesiologist  Anesthesiologist: Heather Roberts, MD Preanesthetic Checklist Completed: patient identified, IV checked, risks and benefits discussed, surgical consent, monitors and equipment checked, pre-op evaluation and timeout performed Spinal Block Patient position: sitting Prep: DuraPrep Patient monitoring: cardiac monitor, continuous pulse ox and blood pressure Approach: midline Location: L2-3 Injection technique: single-shot Needle Needle type: Pencan  Needle gauge: 24 G Needle length: 9 cm Assessment Events: CSF return Additional Notes Functioning IV was confirmed and monitors were applied. Sterile prep and drape, including hand hygiene and sterile gloves were used. The patient was positioned and the spine was prepped. The skin was anesthetized with lidocaine.  Free flow of clear CSF was obtained prior to injecting local anesthetic into the CSF.  The spinal needle aspirated freely following injection.  The needle was carefully withdrawn.  The patient tolerated the procedure well.

## 2020-12-21 NOTE — Plan of Care (Signed)
  Problem: Education: Goal: Knowledge of General Education information will improve Description: Including pain rating scale, medication(s)/side effects and non-pharmacologic comfort measures Outcome: Progressing   Problem: Activity: Goal: Risk for activity intolerance will decrease Outcome: Progressing   Problem: Nutrition: Goal: Adequate nutrition will be maintained Outcome: Progressing   Problem: Elimination: Goal: Will not experience complications related to bowel motility Outcome: Progressing   Problem: Pain Managment: Goal: General experience of comfort will improve Outcome: Progressing   Problem: Education: Goal: Knowledge of the prescribed therapeutic regimen will improve Outcome: Progressing Goal: Understanding of discharge needs will improve Outcome: Progressing Goal: Individualized Educational Video(s) Outcome: Progressing   Problem: Activity: Goal: Ability to avoid complications of mobility impairment will improve Outcome: Progressing   Problem: Clinical Measurements: Goal: Postoperative complications will be avoided or minimized Outcome: Progressing   Problem: Pain Management: Goal: Pain level will decrease with appropriate interventions Outcome: Progressing

## 2020-12-21 NOTE — Interval H&P Note (Signed)
History and Physical Interval Note:  12/21/2020 7:50 AM  Todd Haley  has presented today for surgery, with the diagnosis of Right hip degenerative joint disease.  The various methods of treatment have been discussed with the patient and family. After consideration of risks, benefits and other options for treatment, the patient has consented to  Procedure(s): TOTAL HIP ARTHROPLASTY ANTERIOR APPROACH (Right) as a surgical intervention.  The patient's history has been reviewed, patient examined, no change in status, stable for surgery.  I have reviewed the patient's chart and labs.  Questions were answered to the patient's satisfaction.     [name]

## 2020-12-21 NOTE — Evaluation (Signed)
Physical Therapy Evaluation Patient Details Name: Todd Haley MRN: [id number] DOB: Jul 30, 1961 Today's Date: 12/21/2020   History of Present Illness  s/p R DA THA PMH: HTN, NSTEMI, CVA, DM  Clinical Impression  Pt is s/p THA resulting in the deficits listed below (see PT Problem List). Pt will benefit from skilled PT to increase their independence and safety with mobility to allow discharge to the venue listed below.  Pt  amb 50' with RW and min/guard assist. Anticipate steady progress in acute setting.  Follow Up Recommendations Follow surgeon's recommendation for DC plan and follow-up therapies    Equipment Recommendations  None recommended by PT (unless wants 3in1)    Recommendations for Other Services       Precautions / Restrictions Precautions Precautions: Fall Restrictions Weight Bearing Restrictions: No Other Position/Activity Restrictions: WBAT      Mobility  Bed Mobility Overal bed mobility: Needs Assistance Bed Mobility: Supine to Sit     Supine to sit: Min guard;Min assist     General bed mobility comments: light assist to initiate movement RLE, incr time    Transfers Overall transfer level: Needs assistance Equipment used: Rolling walker (2 wheeled) Transfers: Sit to/from Stand Sit to Stand: Min guard         General transfer comment: cues for hand placement and RLE position  Ambulation/Gait Ambulation/Gait assistance: Min guard Gait Distance (Feet): 50 Feet Assistive device: Rolling walker (2 wheeled) Gait Pattern/deviations: Step-to pattern     General Gait Details: cues for sequence, RW position  Stairs            Wheelchair Mobility    Modified Rankin (Stroke Patients Only)       Balance                                             Pertinent Vitals/Pain Pain Assessment: 0-10 Pain Score: 4  Pain Location: right hip Pain Descriptors / Indicators: Sore;Discomfort Pain Intervention(s): Limited activity  within patient's tolerance;Monitored during session;Premedicated before session;Repositioned    Home Living Family/patient expects to be discharged to:: Private residence Living Arrangements: Spouse/significant other Available Help at Discharge: Family Type of Home: House Home Access: Stairs to enter Entrance Stairs-Rails: Right;Left;Can reach both Entrance Stairs-Number of Steps: 2 Home Layout: One level;Laundry or work area in [employer] Equipment: Environmental consultant - 2 wheels      Prior Function Level of Independence: Independent   Pt is a Armed forces logistics/support/administrative officer        Extremity/Trunk Assessment   Upper Extremity Assessment Upper Extremity Assessment: Overall WFL for tasks assessed    Lower Extremity Assessment Lower Extremity Assessment: RLE deficits/detail       Communication   Communication: No difficulties  Cognition Arousal/Alertness: Awake/alert Behavior During Therapy: WFL for tasks assessed/performed Overall Cognitive Status: Within Functional Limits for tasks assessed                                        General Comments      Exercises Total Joint Exercises Ankle Circles/Pumps: AROM;10 reps;Both   Assessment/Plan    PT Assessment Patient needs continued PT services  PT Problem List Decreased strength;Decreased activity tolerance;Decreased balance;Pain;Decreased knowledge of use of DME;Decreased  mobility       PT Treatment Interventions DME instruction;Gait training;Functional mobility training;Stair training;Therapeutic activities;Therapeutic exercise;Patient/family education    PT Goals (Current goals can be found in the Care Plan section)  Acute Rehab PT Goals Patient Stated Goal: less hip pain PT Goal Formulation: With patient Time For Goal Achievement: 12/28/20 Potential to Achieve Goals: Good    Frequency     Barriers to discharge        Co-evaluation               AM-PAC PT "6  Clicks" Mobility  Outcome Measure Help needed turning from your back to your side while in a flat bed without using bedrails?: A Little Help needed moving from lying on your back to sitting on the side of a flat bed without using bedrails?: A Little Help needed moving to and from a bed to a chair (including a wheelchair)?: A Little Help needed standing up from a chair using your arms (e.g., wheelchair or bedside chair)?: A Little Help needed to walk in hospital room?: A Little Help needed climbing 3-5 steps with a railing? : A Little 6 Click Score: 18    End of Session Equipment Utilized During Treatment: Gait belt Activity Tolerance: Patient tolerated treatment well Patient left: in chair;with call bell/phone within reach;with chair alarm set;with family/visitor present Nurse Communication: Mobility status PT Visit Diagnosis: Difficulty in walking, not elsewhere classified (R26.2)    Time: 7672-0947 PT Time Calculation (min) (ACUTE ONLY): 29 min   Charges:   PT Evaluation $PT Eval Low Complexity: 1 Low PT Treatments $Gait Training: 8-22 mins        Delice Bison, PT  Acute Rehab Dept ([facility]) [phone] Pager [phone]  12/21/2020   [facility] 12/21/2020, 4:12 PM

## 2020-12-22 ENCOUNTER — Encounter (HOSPITAL_COMMUNITY): Payer: Self-pay | Admitting: Orthopedic Surgery

## 2020-12-22 DIAGNOSIS — M1611 Unilateral primary osteoarthritis, right hip: Secondary | ICD-10-CM | POA: Diagnosis not present

## 2020-12-22 LAB — BASIC METABOLIC PANEL
Anion gap: 8 (ref 5–15)
BUN: 30 mg/dL — ABNORMAL HIGH (ref 6–20)
CO2: 24 mmol/L (ref 22–32)
Calcium: 7.9 mg/dL — ABNORMAL LOW (ref 8.9–10.3)
Chloride: 106 mmol/L (ref 98–111)
Creatinine, Ser: 1.11 mg/dL (ref 0.61–1.24)
GFR, Estimated: >60 mL/min (ref >60)
Glucose, Bld: 162 mg/dL — ABNORMAL HIGH (ref 70–99)
Potassium: 3.9 mmol/L (ref 3.5–5.1)
Sodium: 138 mmol/L (ref 135–145)

## 2020-12-22 LAB — CBC
HCT: 35.5 % — ABNORMAL LOW (ref 39.0–52.0)
Hemoglobin: 11.8 g/dL — ABNORMAL LOW (ref 13.0–17.0)
MCH: 28.5 pg (ref 26.0–34.0)
MCHC: 33.2 g/dL (ref 30.0–36.0)
MCV: 85.7 fL (ref 80.0–100.0)
Platelets: 181 10*3/uL (ref 150–400)
RBC: 4.14 MIL/uL — ABNORMAL LOW (ref 4.22–5.81)
RDW: 14.3 % (ref 11.5–15.5)
WBC: 14.2 10*3/uL — ABNORMAL HIGH (ref 4.0–10.5)
nRBC: 0 % (ref 0.0–0.2)

## 2020-12-22 LAB — GLUCOSE, CAPILLARY
Glucose-Capillary: 113 mg/dL — ABNORMAL HIGH (ref 70–99)
Glucose-Capillary: 162 mg/dL — ABNORMAL HIGH (ref 70–99)

## 2020-12-22 MED ORDER — ACETAMINOPHEN 500 MG PO TABS: 1000.0000 mg | ORAL_TABLET | Freq: Four times a day (QID) | ORAL | 0 refills | Status: AC

## 2020-12-22 MED ORDER — ONDANSETRON HCL 4 MG PO TABS: 4.0000 mg | ORAL_TABLET | Freq: Four times a day (QID) | ORAL | 0 refills | Status: AC | PRN

## 2020-12-22 MED ORDER — DOCUSATE SODIUM 100 MG PO CAPS: 100.0000 mg | ORAL_CAPSULE | Freq: Two times a day (BID) | ORAL | 0 refills | Status: AC

## 2020-12-22 MED ORDER — OXYCODONE HCL 5 MG PO TABS: 5.0000 mg | ORAL_TABLET | Freq: Three times a day (TID) | ORAL | 0 refills | Status: AC | PRN

## 2020-12-22 MED ORDER — ASPIRIN 81 MG PO CHEW: 81.0000 mg | CHEWABLE_TABLET | Freq: Two times a day (BID) | ORAL | 0 refills | Status: AC

## 2020-12-22 MED ORDER — SENNA 8.6 MG PO TABS: 1.0000 | ORAL_TABLET | Freq: Two times a day (BID) | ORAL | 0 refills | Status: AC

## 2020-12-22 NOTE — TOC Transition Note (Signed)
Transition of Care [facility]) - CM/SW Discharge Note  Patient Details  Name: Todd Haley MRN: [id number] Date of Birth: 04/10/1961  [facility] CM/SW Contact:  Sherie Don, LCSW Phone Number: 12/22/2020, 9:24 AM  Clinical Narrative: Patient is expected to discharge home after working with PT. CSW met with patient to review discharge plan. Per patient, he will be discharging home with a home exercise program (HEP). Patient has a rolling walker and raised toilet seat at home, so there are no DME needs at this time. TOC signing off.  Final next level of care: Home/Self Care Barriers to Discharge: No Barriers Identified  Patient Goals and CMS Choice Patient states their goals for this hospitalization and ongoing recovery are:: Discharge home with HEP CMS Medicare.gov Compare Post Acute Care list provided to:: Patient Choice offered to / list presented to : NA  Discharge Plan and Services         DME Arranged: N/A DME Agency: NA  Readmission Risk Interventions No flowsheet data found.

## 2020-12-22 NOTE — Progress Notes (Signed)
Physical Therapy Treatment Patient Details Name: Todd Haley MRN: [id number] DOB: [birth year] Today's Date: 12/22/2020    History of Present Illness s/p R DA THA PMH: HTN, NSTEMI, CVA, DM    PT Comments    POD # 1 pm session Assisted with amb an increased distance in hallway, practiced stairs again with family present.  Then returned to room to perform some TE's following HEP handout.  Instructed on proper tech, freq as well as use of ICE.   Addressed all mobility questions, discussed appropriate activity, educated on use of ICE.  Pt ready for D/C to home.   Follow Up Recommendations  Follow surgeon's recommendation for DC plan and follow-up therapies     Equipment Recommendations  None recommended by PT    Recommendations for Other Services       Precautions / Restrictions Precautions Precautions: Fall Restrictions Weight Bearing Restrictions: No Other Position/Activity Restrictions: WBAT    Mobility  Bed Mobility Overal bed mobility: Needs Assistance Bed Mobility: Sit to Supine     Supine to sit: Supervision;Min guard     General bed mobility comments: demonstarted and instructed how to use a belt to self assist LE back to bed.    Transfers Overall transfer level: Needs assistance Equipment used: Rolling walker (2 wheeled) Transfers: Sit to/from Stand Sit to Stand: Supervision;Min guard         General transfer comment: cues for hand placement and RLE position  Ambulation/Gait Ambulation/Gait assistance: Supervision;Min guard Gait Distance (Feet): 75 Feet Assistive device: Rolling walker (2 wheeled) Gait Pattern/deviations: Step-to pattern;Decreased stance time - right Gait velocity: decreased   General Gait Details: cues for sequence, RW position   Stairs Stairs: Yes Stairs assistance: Supervision Stair Management: Two rails;Step to pattern;Forwards Number of Stairs: 2 General stair comments: one initial VC   Wheelchair Mobility    Modified  Rankin (Stroke Patients Only)       Balance                                            Cognition Arousal/Alertness: Awake/alert Behavior During Therapy: WFL for tasks assessed/performed Overall Cognitive Status: Within Functional Limits for tasks assessed                                 General Comments: AxO x 3 motivated Education officer, museum      Exercises   Total Hip Replacement TE's following HEP Handout 10 reps ankle pumps 05 reps knee presses 05 reps heel slides 05 reps SAQ's 05 reps ABD Instructed how to use a belt loop to assist  Followed by ICE    General Comments        Pertinent Vitals/Pain Pain Assessment: 0-10 Pain Score: 3  Pain Location: right hip Pain Descriptors / Indicators: Sore;Discomfort Pain Intervention(s): Monitored during session;Premedicated before session;Repositioned;Ice applied    Home Living                      Prior Function            PT Goals (current goals can now be found in the care plan section) Progress towards PT goals: Progressing toward goals    Frequency    7X/week      PT Plan Current plan remains appropriate    Co-evaluation  AM-PAC PT "6 Clicks" Mobility   Outcome Measure  Help needed turning from your back to your side while in a flat bed without using bedrails?: A Little Help needed moving from lying on your back to sitting on the side of a flat bed without using bedrails?: A Little Help needed moving to and from a bed to a chair (including a wheelchair)?: A Little Help needed standing up from a chair using your arms (e.g., wheelchair or bedside chair)?: A Little Help needed to walk in hospital room?: A Little Help needed climbing 3-5 steps with a railing? : A Little 6 Click Score: 18    End of Session Equipment Utilized During Treatment: Gait belt Activity Tolerance: Patient tolerated treatment well Patient left: in bed Nurse  Communication: Mobility status PT Visit Diagnosis: Difficulty in walking, not elsewhere classified (R26.2)     Time: 1355-1420 PT Time Calculation (min) (ACUTE ONLY): 25 min  Charges:  $Gait Training: 8-22 mins $Therapeutic Exercise: 8-22 mins                     Felecia Shelling  PTA Acute  Rehabilitation Services Pager      [phone] Office      [phone]

## 2020-12-22 NOTE — Plan of Care (Signed)
Plan of care reviewed and discussed with the patient. 

## 2020-12-22 NOTE — Progress Notes (Signed)
Physical Therapy Treatment Patient Details Name: Todd Haley MRN: [id number] DOB: 06/26/1961 Today's Date: 12/22/2020    History of Present Illness s/p R DA THA PMH: HTN, NSTEMI, CVA, DM    PT Comments    POD # 1 am session. Assisted OOB to amb in hallway and practiced stairs.  Pt will need another PT session to address HEP.   Follow Up Recommendations  Follow surgeon's recommendation for DC plan and follow-up therapies     Equipment Recommendations  None recommended by PT    Recommendations for Other Services       Precautions / Restrictions Precautions Precautions: Fall Restrictions Weight Bearing Restrictions: No Other Position/Activity Restrictions: WBAT    Mobility  Bed Mobility Overal bed mobility: Needs Assistance Bed Mobility: Supine to Sit     Supine to sit: Supervision;Min guard     General bed mobility comments: demonstarted and instructed how to use a belt to self assist LE    Transfers Overall transfer level: Needs assistance Equipment used: Rolling walker (2 wheeled) Transfers: Sit to/from Stand Sit to Stand: Supervision;Min guard         General transfer comment: cues for hand placement and RLE position  Ambulation/Gait Ambulation/Gait assistance: Supervision;Min guard Gait Distance (Feet): 75 Feet Assistive device: Rolling walker (2 wheeled) Gait Pattern/deviations: Step-to pattern;Decreased stance time - right Gait velocity: decreased   General Gait Details: cues for sequence, RW position   Stairs    MinGuard Assist 2 steps B rails with 50% VC's on proper tech/safety         Wheelchair Mobility    Modified Rankin (Stroke Patients Only)       Balance                                            Cognition Arousal/Alertness: Awake/alert Behavior During Therapy: WFL for tasks assessed/performed Overall Cognitive Status: Within Functional Limits for tasks assessed                                  General Comments: 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        Pertinent Vitals/Pain Pain Assessment: 0-10 Pain Score: 3  Pain Location: right hip Pain Descriptors / Indicators: Sore;Discomfort Pain Intervention(s): Monitored during session;Premedicated before session;Repositioned;Ice applied    Home Living                      Prior Function            PT Goals (current goals can now be found in the care plan section) Progress towards PT goals: Progressing toward goals    Frequency    7X/week      PT Plan Current plan remains appropriate    Co-evaluation              AM-PAC PT "6 Clicks" Mobility   Outcome Measure  Help needed turning from your back to your side while in a flat bed without using bedrails?: A Little Help needed moving from lying on your back to sitting on the side of a flat bed without using bedrails?: A Little Help needed moving to and from a bed to a chair (including a wheelchair)?: A Little Help needed  standing up from a chair using your arms (e.g., wheelchair or bedside chair)?: A Little Help needed to walk in hospital room?: A Little Help needed climbing 3-5 steps with a railing? : A Little 6 Click Score: 18    End of Session Equipment Utilized During Treatment: Gait belt Activity Tolerance: Patient tolerated treatment well Patient left: in chair;with call bell/phone within reach;with chair alarm set;with family/visitor present Nurse Communication: Mobility status PT Visit Diagnosis: Difficulty in walking, not elsewhere classified (R26.2)     Time: 1110-1140 PT Time Calculation (min) (ACUTE ONLY): 30 min  Charges:  $Gait Training: 8-22 mins $Therapeutic Activity: 8-22 mins                     Felecia Shelling  PTA Acute  Rehabilitation Services Pager      [phone] Office      [phone]

## 2020-12-22 NOTE — Progress Notes (Signed)
    Subjective:  Patient reports pain as mild.  Denies N/V/CP/SOB.   Objective:   VITALS:   Vitals:   12/21/20 1741 12/21/20 2100 12/22/20 0107 12/22/20 0531  BP: 118/73 110/73 109/69 110/73  Pulse: 65 69 (!) 56 (!) 54  Resp: 18 16 16 16   Temp:  98.3 F (36.8 C) 97.9 F (36.6 C) 97.8 F (36.6 C)  TempSrc:  Oral Oral Oral  SpO2: 97% 96% 95% 97%  Weight:      Height:        NAD ABD soft Neurovascular intact Sensation intact distally Intact pulses distally Dorsiflexion/Plantar flexion intact Incision: dressing C/D/I   Lab Results  Component Value Date   WBC 14.2 (H) 12/22/2020   HGB 11.8 (L) 12/22/2020   HCT 35.5 (L) 12/22/2020   MCV 85.7 12/22/2020   PLT 181 12/22/2020   BMET    Component Value Date/Time   NA 138 12/22/2020 0308   K 3.9 12/22/2020 0308   CL 106 12/22/2020 0308   CO2 24 12/22/2020 0308   GLUCOSE 162 (H) 12/22/2020 0308   BUN 30 (H) 12/22/2020 0308   CREATININE 1.11 12/22/2020 0308   CALCIUM 7.9 (L) 12/22/2020 0308   GFRNONAA >60 12/22/2020 0308   GFRAA >60 12/24/2018 0219     Assessment/Plan: 1 Day Post-Op   Principal Problem:   Osteoarthritis of right hip   WBAT with walker DVT ppx: Aspirin and plavix, SCDs, TEDS PO pain control PT/OT Dispo: D/C home once cleared by therapy    0220 12/22/2020, 10:32 AM   [facility] is now [facility] [address], [place], [place] Phone: [phone] [email or web address] Facebook  [phone]

## 2021-01-05 ENCOUNTER — Other Ambulatory Visit: Payer: Self-pay | Admitting: Cardiology

## 2021-07-13 ENCOUNTER — Other Ambulatory Visit: Payer: Self-pay | Admitting: Cardiology

## 2021-08-11 ENCOUNTER — Other Ambulatory Visit: Payer: Self-pay | Admitting: Cardiology

## 2021-08-26 ENCOUNTER — Other Ambulatory Visit: Payer: Self-pay | Admitting: Cardiology

## 2021-09-14 IMAGING — RF DG HIP (WITH PELVIS) OPERATIVE*R*
1 series · 4 of 4 positions shown · non-contrast
Comparison: None.

CLINICAL DATA: Intraoperative imaging for right hip replacement.

EXAM:
OPERATIVE RIGHT HIP (WITH PELVIS IF PERFORMED) 4 VIEWS
TECHNIQUE: Fluoroscopic spot image(s) were submitted for interpretation
post-operatively.

[Series 1: unknown protocol · 0.20mm/px · 4 of 4 slices shown]
[im 1/4]
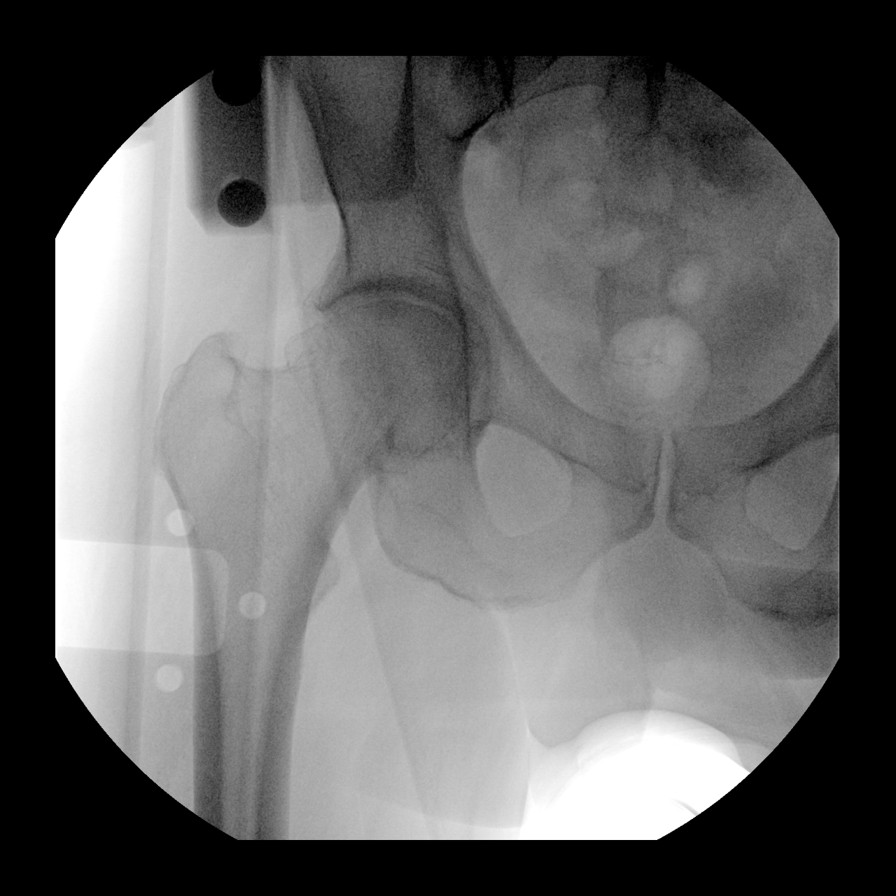
[im 2/4]
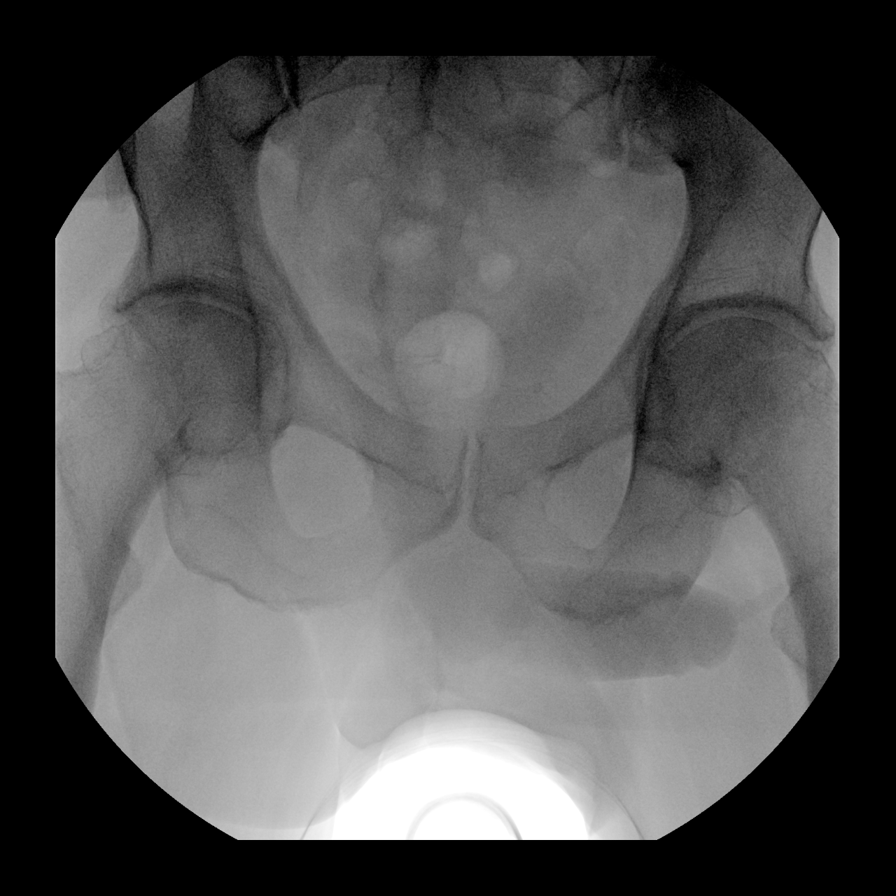
[im 3/4]
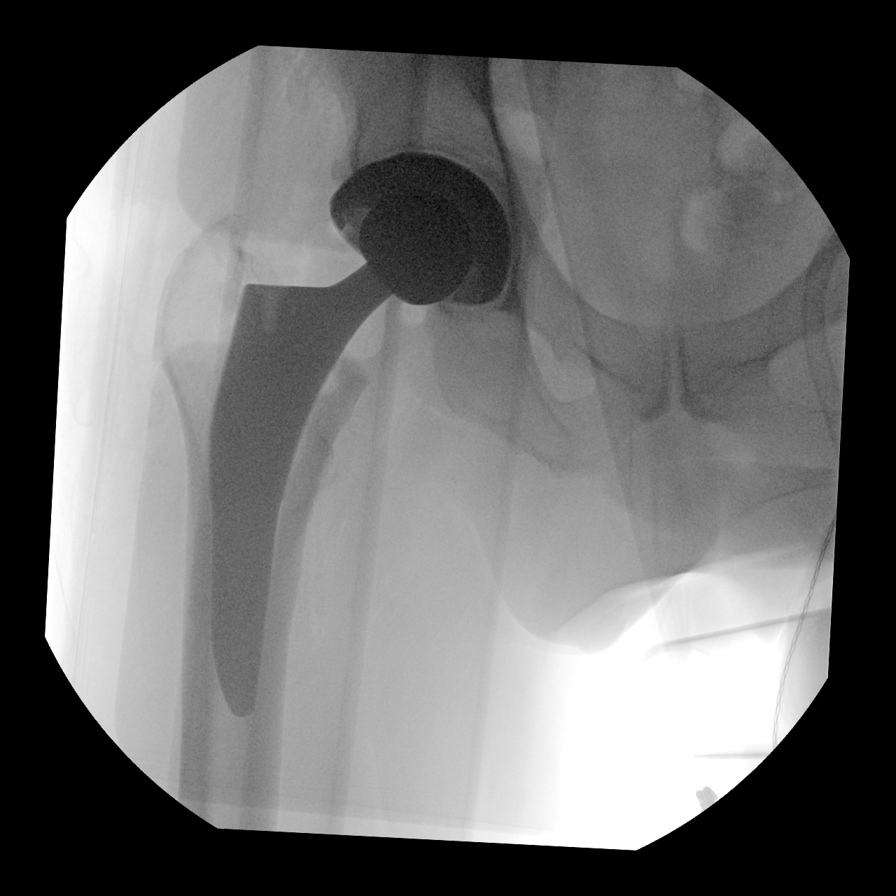
[im 4/4]
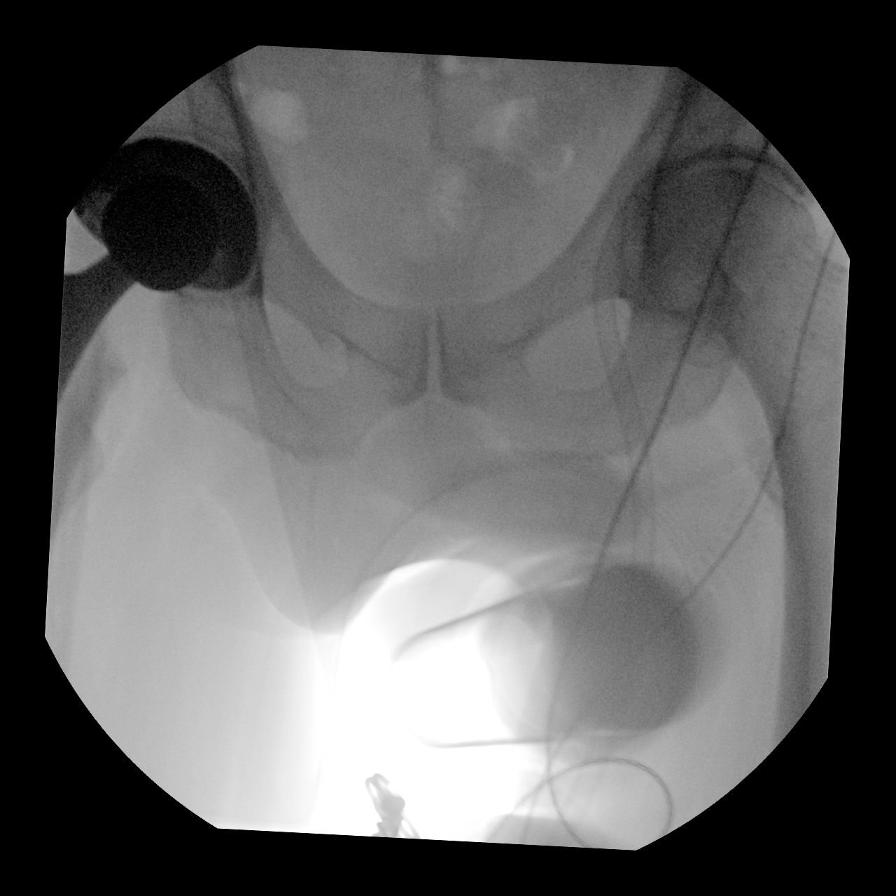

[4 of 4 positions shown; findings below may reference images not displayed]

FINDINGS: Provided intraoperative fluoroscopic spot images demonstrate
placement of a right total hip arthroplasty. No acute abnormality is
seen.
IMPRESSION: Intraoperative imaging for right hip replacement.

## 2021-09-18 ENCOUNTER — Other Ambulatory Visit: Payer: Self-pay | Admitting: Cardiology

## 2022-03-06 ENCOUNTER — Other Ambulatory Visit: Payer: Self-pay | Admitting: Cardiology

## 2022-03-07 ENCOUNTER — Other Ambulatory Visit: Payer: Self-pay | Admitting: Cardiology

## 2022-04-02 ENCOUNTER — Other Ambulatory Visit: Payer: Self-pay | Admitting: Cardiology

## 2022-04-15 ENCOUNTER — Other Ambulatory Visit: Payer: Self-pay | Admitting: Cardiology

## 2022-05-24 ENCOUNTER — Other Ambulatory Visit: Payer: Self-pay | Admitting: Cardiology

## 2024-06-23 ENCOUNTER — Encounter (INDEPENDENT_AMBULATORY_CARE_PROVIDER_SITE_OTHER): Payer: Self-pay | Admitting: *Deleted
